# Patient Record
Sex: Male | Born: 1955
Health system: Southern US, Community
[De-identification: ages and names within clinical notes are randomized; demographics above are authoritative.]

## PROBLEM LIST (undated history)

## (undated) DIAGNOSIS — T4145XA Adverse effect of unspecified anesthetic, initial encounter: Secondary | ICD-10-CM

## (undated) DIAGNOSIS — I2510 Atherosclerotic heart disease of native coronary artery without angina pectoris: Secondary | ICD-10-CM

## (undated) DIAGNOSIS — E781 Pure hyperglyceridemia: Secondary | ICD-10-CM

## (undated) DIAGNOSIS — I519 Heart disease, unspecified: Secondary | ICD-10-CM

## (undated) DIAGNOSIS — E119 Type 2 diabetes mellitus without complications: Secondary | ICD-10-CM

## (undated) DIAGNOSIS — Z006 Encounter for examination for normal comparison and control in clinical research program: Secondary | ICD-10-CM

## (undated) DIAGNOSIS — J45909 Unspecified asthma, uncomplicated: Secondary | ICD-10-CM

## (undated) DIAGNOSIS — Z95820 Peripheral vascular angioplasty status with implants and grafts: Secondary | ICD-10-CM

## (undated) DIAGNOSIS — M67919 Unspecified disorder of synovium and tendon, unspecified shoulder: Secondary | ICD-10-CM

## (undated) DIAGNOSIS — T8859XA Other complications of anesthesia, initial encounter: Secondary | ICD-10-CM

## (undated) DIAGNOSIS — L4050 Arthropathic psoriasis, unspecified: Secondary | ICD-10-CM

## (undated) DIAGNOSIS — E538 Deficiency of other specified B group vitamins: Secondary | ICD-10-CM

## (undated) DIAGNOSIS — N529 Male erectile dysfunction, unspecified: Secondary | ICD-10-CM

## (undated) DIAGNOSIS — I10 Essential (primary) hypertension: Secondary | ICD-10-CM

## (undated) HISTORY — DX: Type 2 diabetes mellitus without complications: E11.9

## (undated) HISTORY — DX: Pure hyperglyceridemia: E78.1

## (undated) HISTORY — PX: OTHER SURGICAL HISTORY: SHX169

## (undated) HISTORY — PX: TOTAL KNEE ARTHROPLASTY: SHX125

## (undated) HISTORY — DX: Essential (primary) hypertension: I10

## (undated) HISTORY — DX: Unspecified asthma, uncomplicated: J45.909

## (undated) HISTORY — DX: Male erectile dysfunction, unspecified: N52.9

## (undated) HISTORY — DX: Unspecified disorder of synovium and tendon, unspecified shoulder: M67.919

## (undated) HISTORY — DX: Deficiency of other specified B group vitamins: E53.8

## (undated) HISTORY — DX: Arthropathic psoriasis, unspecified: L40.50

---
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
## 1978-11-24 HISTORY — PX: KNEE SURGERY: SHX244

## 1997-11-24 HISTORY — PX: APPENDECTOMY: SHX54

## 1998-04-18 ENCOUNTER — Other Ambulatory Visit: Admission: RE | Admit: 1998-04-18 | Discharge: 1998-04-18 | Payer: Self-pay | Admitting: Dermatology

## 1999-06-30 ENCOUNTER — Emergency Department (HOSPITAL_COMMUNITY): Admission: EM | Admit: 1999-06-30 | Discharge: 1999-06-30 | Payer: Self-pay | Admitting: Emergency Medicine

## 1999-06-30 ENCOUNTER — Encounter: Payer: Self-pay | Admitting: Emergency Medicine

## 2001-02-01 ENCOUNTER — Ambulatory Visit (HOSPITAL_COMMUNITY): Admission: RE | Admit: 2001-02-01 | Discharge: 2001-02-01 | Payer: Self-pay | Admitting: Gastroenterology

## 2004-05-13 ENCOUNTER — Ambulatory Visit (HOSPITAL_COMMUNITY): Admission: RE | Admit: 2004-05-13 | Discharge: 2004-05-13 | Payer: Self-pay | Admitting: Gastroenterology

## 2006-06-18 ENCOUNTER — Ambulatory Visit (HOSPITAL_COMMUNITY): Admission: RE | Admit: 2006-06-18 | Discharge: 2006-06-18 | Payer: Self-pay | Admitting: Internal Medicine

## 2016-03-10 DIAGNOSIS — E538 Deficiency of other specified B group vitamins: Secondary | ICD-10-CM | POA: Diagnosis not present

## 2016-04-04 DIAGNOSIS — J45909 Unspecified asthma, uncomplicated: Secondary | ICD-10-CM | POA: Diagnosis not present

## 2016-04-04 DIAGNOSIS — I10 Essential (primary) hypertension: Secondary | ICD-10-CM | POA: Diagnosis not present

## 2016-04-04 DIAGNOSIS — E1142 Type 2 diabetes mellitus with diabetic polyneuropathy: Secondary | ICD-10-CM | POA: Diagnosis not present

## 2016-04-04 DIAGNOSIS — G608 Other hereditary and idiopathic neuropathies: Secondary | ICD-10-CM | POA: Diagnosis not present

## 2016-04-10 DIAGNOSIS — D81818 Other biotin-dependent carboxylase deficiency: Secondary | ICD-10-CM | POA: Diagnosis not present

## 2016-05-08 DIAGNOSIS — H43813 Vitreous degeneration, bilateral: Secondary | ICD-10-CM | POA: Diagnosis not present

## 2016-05-08 DIAGNOSIS — E119 Type 2 diabetes mellitus without complications: Secondary | ICD-10-CM | POA: Diagnosis not present

## 2016-05-14 DIAGNOSIS — E538 Deficiency of other specified B group vitamins: Secondary | ICD-10-CM | POA: Diagnosis not present

## 2016-08-20 DIAGNOSIS — L4059 Other psoriatic arthropathy: Secondary | ICD-10-CM | POA: Diagnosis not present

## 2016-08-20 DIAGNOSIS — Z79899 Other long term (current) drug therapy: Secondary | ICD-10-CM | POA: Diagnosis not present

## 2016-08-20 DIAGNOSIS — L409 Psoriasis, unspecified: Secondary | ICD-10-CM | POA: Diagnosis not present

## 2016-08-20 DIAGNOSIS — M2550 Pain in unspecified joint: Secondary | ICD-10-CM | POA: Diagnosis not present

## 2016-08-22 DIAGNOSIS — Z23 Encounter for immunization: Secondary | ICD-10-CM | POA: Diagnosis not present

## 2016-08-26 DIAGNOSIS — I10 Essential (primary) hypertension: Secondary | ICD-10-CM | POA: Diagnosis not present

## 2016-08-26 DIAGNOSIS — Z0000 Encounter for general adult medical examination without abnormal findings: Secondary | ICD-10-CM | POA: Diagnosis not present

## 2016-08-26 DIAGNOSIS — G608 Other hereditary and idiopathic neuropathies: Secondary | ICD-10-CM | POA: Diagnosis not present

## 2016-08-26 DIAGNOSIS — E1142 Type 2 diabetes mellitus with diabetic polyneuropathy: Secondary | ICD-10-CM | POA: Diagnosis not present

## 2016-08-26 DIAGNOSIS — E782 Mixed hyperlipidemia: Secondary | ICD-10-CM | POA: Diagnosis not present

## 2016-08-26 DIAGNOSIS — E538 Deficiency of other specified B group vitamins: Secondary | ICD-10-CM | POA: Diagnosis not present

## 2016-09-03 DIAGNOSIS — M9903 Segmental and somatic dysfunction of lumbar region: Secondary | ICD-10-CM | POA: Diagnosis not present

## 2016-09-03 DIAGNOSIS — M9901 Segmental and somatic dysfunction of cervical region: Secondary | ICD-10-CM | POA: Diagnosis not present

## 2016-09-03 DIAGNOSIS — M9902 Segmental and somatic dysfunction of thoracic region: Secondary | ICD-10-CM | POA: Diagnosis not present

## 2016-09-03 DIAGNOSIS — M4812 Ankylosing hyperostosis [Forestier], cervical region: Secondary | ICD-10-CM | POA: Diagnosis not present

## 2016-09-24 DIAGNOSIS — R0789 Other chest pain: Secondary | ICD-10-CM | POA: Diagnosis not present

## 2016-09-28 NOTE — Progress Notes (Signed)
Cardiology Office Note  NEW PATIENT VISIT Date:  09/29/2016   ID:  Jermaine Jennings, DOB 1956/07/17, MRN [id number]  PCP:  Irven Shelling, MD  Cardiologist:  Dr. Angelena Form    Chief Complaint  Patient presents with  . Chest Pain    ongoing      History of Present Illness: Jermaine Jennings is a 60 y.o. male who presents for exertional chest pain.  He has a hx of DM-2, HTN, elevated triglycerides and FH of CAD.  Beginning about a year ago he began having chest pain with exertion. Not all the time.  Over the last month he has had increase of the tightness now occurring with any activity except simple walking.  No associated symptoms of nausea or diaphoresis.  He does have DOE with walking stairs fast and he does have hx of Asthma.  All his tightness goes away with rest.   Has not awakened from sleep.      Past Medical History:  Diagnosis Date  . Asthma   . Benign essential HTN   . DM (diabetes mellitus), type 2 ([place])   . Hypertriglyceridemia   . Psoriatic arthritis ([place])   . Rotator cuff disorder   . Vitamin B 12 deficiency     Past Surgical History:  Procedure Laterality Date  . APPENDECTOMY  1999  . colon polyps    . KNEE SURGERY Right 1975   ligament and spurs  . KNEE SURGERY Left 1980   ligaments and spurs  . TOTAL KNEE ARTHROPLASTY  1990's     Current Outpatient Prescriptions  Medication Sig Dispense Refill  . albuterol (PROVENTIL HFA;VENTOLIN HFA) 108 (90 Base) MCG/ACT inhaler Inhale 2 puffs into the lungs every 4 (four) hours as needed for wheezing or shortness of breath.    Marland Kitchen amitriptyline (ELAVIL) 50 MG tablet Take 100 mg by mouth daily.  0  . amLODipine (NORVASC) 5 MG tablet Take 5 mg by mouth daily.  1  . halobetasol (ULTRAVATE) 0.05 % cream Apply 1 application topically 2 (two) times daily as needed.    Marland Kitchen LANTUS SOLOSTAR 100 UNIT/ML Solostar Pen Inject 60 Units as directed daily.  1  . losartan (COZAAR) 50 MG tablet Take 50 mg by mouth daily.  1  .  LYRICA 150 MG capsule Take 150 mg by mouth 2 (two) times daily.  0  . metFORMIN (GLUCOPHAGE-XR) 500 MG 24 hr tablet Take 1,000 mg by mouth 2 (two) times daily.  1  . TRULICITY 1.5 0000000 SOPN Inject 0.5 mLs as directed once a week.  1   No current facility-administered medications for this visit.     Allergies:   Gabapentin; Indocin [indomethacin]; and Penicillins    Social History:  The patient  reports that he has never smoked. He has never used smokeless tobacco. He reports that he drinks alcohol. He reports that he does not use drugs.   Family History:  The patient's family history includes Arthritis in his maternal grandfather; Breast cancer in his mother; CAD in his mother; CVA in his father; Diabetes in his brother; Glaucoma in his mother; Healthy in his son and son; Heart disease in his mother; Hypertension in his brother, father, and mother; Neuropathy in his father; Other in his father.    ROS:  General:no colds or fevers, no weight changes Skin:no rashes or ulcers HEENT:no blurred vision, no congestion CV:see HPI PUL:see HPI GI:no diarrhea constipation or melena, no indigestion GU:no hematuria, no dysuria MS:+ Lt knee  pain, no claudication- though thighs tighten up with heavy exertion, resolves with rest. Neuro:no syncope, no lightheadedness Endo:+ diabetes on insulin usually controlled but HGBA1C was 8.3, no thyroid disease  Wt Readings from Last 3 Encounters:  09/29/16 251 lb (113.9 kg)     PHYSICAL EXAM: VS:  BP 120/78   Pulse 80   Ht 6\' 2"  (1.88 m)   Wt 251 lb (113.9 kg)   BMI 32.23 kg/m  , BMI Body mass index is 32.23 kg/m. General:Pleasant affect, NAD Skin:Warm and dry, brisk capillary refill HEENT:normocephalic, sclera clear, mucus membranes moist Neck:supple, no JVD, no bruits  Heart:S1S2 RRR without murmur, gallup, rub or click Lungs:clear without rales, rhonchi, or wheezes [id number], non tender, + BS, do not palpate liver spleen or masses Ext:no  lower ext edema, 2+ posterior tib pulses, 2+ radial pulses Neuro:alert and oriented X 3, MAE, follows commands, + facial symmetry    EKG:  EKG is ordered today. The ekg ordered today demonstrates SR normal EKG, only change from 09/24/16 is no longer with PVCs.    Recent Labs: No results found for requested labs within last 8760 hours.  Per [facility] records.    Lipid Panel No results found for: CHOL, TRIG, HDL, CHOLHDL, VLDL, LDLCALC, LDLDIRECT GLucose 126, Cr. 1.07, Na 139 K+ 4.1, AST 42, ALT 60 T chol 196, HDL 38, TG 440, NHDL 159    Other studies Reviewed: Additional studies/ records that were reviewed today include: previous OV notes and labs, 'EKG.   ASSESSMENT AND PLAN:  1.  Crescendo angina over last month with risk of DM-2, HTN, FH CAD and elevated TG.  Dr. Angelena Form has seen pt as well and we discussed treatment plan of cardiac cath, these symptoms sound anginal.  Plan for cath Friday this week with Dr. Angelena Form.  Pt has NTG sl if needed.    The patient understands that risks included but are not limited to stroke (1 in 1000), death (1 in 60), kidney failure [usually temporary] (1 in 500), bleeding (1 in 200), allergic reaction [possibly serious] (1 in 200).   2. DM-2 followed by Dr. Laurann Montana  3. HTN controlled with treatment.   4. Hypertriglyceridemia         Current medicines are reviewed with the patient today.  The patient Has no concerns regarding medicines.  The following changes have been made:  See above Labs/ tests ordered today include:see above  Disposition:   FU:  see above  Signed, Cecilie Kicks, NP  09/29/2016 10:13 AM    [place], [place], [place], [place] Phone: [phone]; Fax: [phone]  I have personally seen and examined this patient  I agree with the assessment and plan as outlined above.   Lauree Chandler 09/29/2016 1:42  PM

## 2016-09-29 ENCOUNTER — Encounter: Payer: Self-pay | Admitting: Cardiology

## 2016-09-29 ENCOUNTER — Ambulatory Visit (INDEPENDENT_AMBULATORY_CARE_PROVIDER_SITE_OTHER): Payer: BLUE CROSS/BLUE SHIELD | Admitting: Cardiology

## 2016-09-29 ENCOUNTER — Encounter (INDEPENDENT_AMBULATORY_CARE_PROVIDER_SITE_OTHER): Payer: Self-pay

## 2016-09-29 ENCOUNTER — Encounter: Payer: Self-pay | Admitting: *Deleted

## 2016-09-29 VITALS — BP 120/78 | HR 80 | Ht 74.0 in | Wt 251.0 lb

## 2016-09-29 DIAGNOSIS — N529 Male erectile dysfunction, unspecified: Secondary | ICD-10-CM | POA: Insufficient documentation

## 2016-09-29 DIAGNOSIS — J45909 Unspecified asthma, uncomplicated: Secondary | ICD-10-CM | POA: Insufficient documentation

## 2016-09-29 DIAGNOSIS — E1142 Type 2 diabetes mellitus with diabetic polyneuropathy: Secondary | ICD-10-CM | POA: Insufficient documentation

## 2016-09-29 DIAGNOSIS — I10 Essential (primary) hypertension: Secondary | ICD-10-CM | POA: Diagnosis not present

## 2016-09-29 DIAGNOSIS — E1122 Type 2 diabetes mellitus with diabetic chronic kidney disease: Secondary | ICD-10-CM

## 2016-09-29 DIAGNOSIS — R079 Chest pain, unspecified: Secondary | ICD-10-CM | POA: Diagnosis not present

## 2016-09-29 DIAGNOSIS — E669 Obesity, unspecified: Secondary | ICD-10-CM | POA: Insufficient documentation

## 2016-09-29 DIAGNOSIS — E538 Deficiency of other specified B group vitamins: Secondary | ICD-10-CM | POA: Diagnosis not present

## 2016-09-29 DIAGNOSIS — K200 Eosinophilic esophagitis: Secondary | ICD-10-CM | POA: Insufficient documentation

## 2016-09-29 DIAGNOSIS — Z01812 Encounter for preprocedural laboratory examination: Secondary | ICD-10-CM | POA: Diagnosis not present

## 2016-09-29 DIAGNOSIS — E781 Pure hyperglyceridemia: Secondary | ICD-10-CM | POA: Insufficient documentation

## 2016-09-29 DIAGNOSIS — D126 Benign neoplasm of colon, unspecified: Secondary | ICD-10-CM | POA: Insufficient documentation

## 2016-09-29 DIAGNOSIS — E782 Mixed hyperlipidemia: Secondary | ICD-10-CM | POA: Insufficient documentation

## 2016-09-29 DIAGNOSIS — L409 Psoriasis, unspecified: Secondary | ICD-10-CM | POA: Insufficient documentation

## 2016-09-29 DIAGNOSIS — L4050 Arthropathic psoriasis, unspecified: Secondary | ICD-10-CM | POA: Insufficient documentation

## 2016-09-29 DIAGNOSIS — G6289 Other specified polyneuropathies: Secondary | ICD-10-CM | POA: Insufficient documentation

## 2016-09-29 DIAGNOSIS — G608 Other hereditary and idiopathic neuropathies: Secondary | ICD-10-CM

## 2016-09-29 DIAGNOSIS — N181 Chronic kidney disease, stage 1: Secondary | ICD-10-CM

## 2016-09-29 LAB — BASIC METABOLIC PANEL
BUN: 12 mg/dL (ref 7–25)
CHLORIDE: 103 mmol/L (ref 98–110)
CO2: 24 mmol/L (ref 20–31)
CREATININE: 0.99 mg/dL (ref 0.70–1.25)
Calcium: 9.4 mg/dL (ref 8.6–10.3)
Glucose, Bld: 174 mg/dL — ABNORMAL HIGH (ref 65–99)
POTASSIUM: 4.4 mmol/L (ref 3.5–5.3)
SODIUM: 137 mmol/L (ref 135–146)

## 2016-09-29 LAB — CBC WITH DIFFERENTIAL/PLATELET
BASOS PCT: 1 %
Basophils Absolute: 94 cells/uL (ref 0–200)
EOS ABS: 376 {cells}/uL (ref 15–500)
Eosinophils Relative: 4 %
HCT: 40.1 % (ref 38.5–50.0)
Hemoglobin: 13.8 g/dL (ref 13.2–17.1)
LYMPHS ABS: 2068 {cells}/uL (ref 850–3900)
Lymphocytes Relative: 22 %
MCH: 28.9 pg (ref 27.0–33.0)
MCHC: 34.4 g/dL (ref 32.0–36.0)
MCV: 83.9 fL (ref 80.0–100.0)
MONO ABS: 658 {cells}/uL (ref 200–950)
MONOS PCT: 7 %
MPV: 9.2 fL (ref 7.5–12.5)
NEUTROS ABS: 6204 {cells}/uL (ref 1500–7800)
Neutrophils Relative %: 66 %
PLATELETS: 308 10*3/uL (ref 140–400)
RBC: 4.78 MIL/uL (ref 4.20–5.80)
RDW: 12.8 % (ref 11.0–15.0)
WBC: 9.4 10*3/uL (ref 3.8–10.8)

## 2016-09-29 NOTE — Patient Instructions (Addendum)
Medication Instructions:    Your physician recommends that you continue on your current medications as directed. Please refer to the Current Medication list given to you today.  --- If you need a refill on your cardiac medications before your next appointment, please call your pharmacy. ---  Labwork:  Pre procedure labs today: BMET & CBC w/ diff  Testing/Procedures: Your physician has requested that you have a cardiac catheterization. Cardiac catheterization is used to diagnose and/or treat various heart conditions. Doctors may recommend this procedure for a number of different reasons. The most common reason is to evaluate chest pain. Chest pain can be a symptom of coronary artery disease (CAD), and cardiac catheterization can show whether plaque is narrowing or blocking your heart's arteries. This procedure is also used to evaluate the valves, as well as measure the blood flow and oxygen levels in different parts of your heart. For further information please visit [email or web address]. Please follow instruction sheet, as given.  Follow-Up:  To be determined upon discharge from cardiac catheterization.  Thank you for choosing [facility]!!     Any Other Special Instructions Will Be Listed Below (If Applicable).  Coronary Angiogram A coronary angiogram, also called coronary angiography, is an X-ray procedure used to look at the arteries in the heart. In this procedure, a dye (contrast dye) is injected through a long, hollow tube (catheter). The catheter is about the size of a piece of cooked spaghetti and is inserted through your groin, wrist, or arm. The dye is injected into each artery, and X-rays are then taken to show if there is a blockage in the arteries of your heart. LET [facility] CARE PROVIDER KNOW ABOUT:  Any allergies you have, including allergies to shellfish or contrast dye.   All medicines you are taking, including vitamins, herbs, eye drops, creams, and  over-the-counter medicines.   Previous problems you or members of your family have had with the use of anesthetics.   Any blood disorders you have.   Previous surgeries you have had.  History of kidney problems or failure.   Other medical conditions you have. RISKS AND COMPLICATIONS  Generally, a coronary angiogram is a safe procedure. However, problems can occur and include:  Allergic reaction to the dye.  Bleeding from the access site or other locations.  Kidney injury, especially in people with impaired kidney function.  Stroke (rare).  Heart attack (rare). BEFORE THE PROCEDURE   Do not eat or drink anything after midnight the night before the procedure or as directed by your health care provider.   Ask your health care provider about changing or stopping your regular medicines. This is especially important if you are taking diabetes medicines or blood thinners. PROCEDURE  You may be given a medicine to help you relax (sedative) before the procedure. This medicine is given through an intravenous (IV) access tube that is inserted into one of your veins.   The area where the catheter will be inserted will be washed and shaved. This is usually done in the groin but may be done in the fold of your arm (near your elbow) or in the wrist.   A medicine will be given to numb the area where the catheter will be inserted (local anesthetic).   The health care provider will insert the catheter into an artery. The catheter will be guided by using a special type of X-ray (fluoroscopy) of the blood vessel being examined.   A special dye will then be injected into the  catheter, and X-rays will be taken. The dye will help to show where any narrowing or blockages are located in the heart arteries.  AFTER THE PROCEDURE   If the procedure is done through the leg, you will be kept in bed lying flat for several hours. You will be instructed to not bend or cross your legs.  The  insertion site will be checked frequently.   The pulse in your feet or wrist will be checked frequently.   Additional blood tests, X-rays, and an electrocardiogram may be done.    This information is not intended to replace advice given to you by your health care provider. Make sure you discuss any questions you have with your health care provider.   Document Released: 05/17/2003 Document Revised: 12/01/2014 Document Reviewed: 04/04/2013 Elsevier Interactive Patient Education [employer].

## 2016-10-03 ENCOUNTER — Encounter (HOSPITAL_COMMUNITY): Payer: Self-pay | Admitting: Cardiovascular Disease

## 2016-10-03 ENCOUNTER — Encounter (HOSPITAL_COMMUNITY)
Admission: RE | Disposition: A | Discharge status: Home / Self Care | Payer: Self-pay | Source: Ambulatory Visit | Attending: Cardiovascular Disease

## 2016-10-03 ENCOUNTER — Ambulatory Visit (HOSPITAL_COMMUNITY)
Admission: RE | Admit: 2016-10-03 | Discharge: 2016-10-04 | Disposition: A | Discharge status: Home / Self Care | Payer: BLUE CROSS/BLUE SHIELD | Source: Ambulatory Visit | Attending: Cardiovascular Disease | Admitting: Cardiovascular Disease

## 2016-10-03 DIAGNOSIS — I10 Essential (primary) hypertension: Secondary | ICD-10-CM | POA: Diagnosis not present

## 2016-10-03 DIAGNOSIS — I2582 Chronic total occlusion of coronary artery: Secondary | ICD-10-CM | POA: Insufficient documentation

## 2016-10-03 DIAGNOSIS — Z823 Family history of stroke: Secondary | ICD-10-CM | POA: Insufficient documentation

## 2016-10-03 DIAGNOSIS — E781 Pure hyperglyceridemia: Secondary | ICD-10-CM | POA: Diagnosis present

## 2016-10-03 DIAGNOSIS — I519 Heart disease, unspecified: Secondary | ICD-10-CM | POA: Diagnosis present

## 2016-10-03 DIAGNOSIS — I200 Unstable angina: Secondary | ICD-10-CM | POA: Diagnosis present

## 2016-10-03 DIAGNOSIS — J45909 Unspecified asthma, uncomplicated: Secondary | ICD-10-CM | POA: Diagnosis present

## 2016-10-03 DIAGNOSIS — I2510 Atherosclerotic heart disease of native coronary artery without angina pectoris: Secondary | ICD-10-CM | POA: Diagnosis present

## 2016-10-03 DIAGNOSIS — Z8249 Family history of ischemic heart disease and other diseases of the circulatory system: Secondary | ICD-10-CM | POA: Diagnosis not present

## 2016-10-03 DIAGNOSIS — Z006 Encounter for examination for normal comparison and control in clinical research program: Secondary | ICD-10-CM

## 2016-10-03 DIAGNOSIS — I25110 Atherosclerotic heart disease of native coronary artery with unstable angina pectoris: Secondary | ICD-10-CM | POA: Diagnosis not present

## 2016-10-03 DIAGNOSIS — Z794 Long term (current) use of insulin: Secondary | ICD-10-CM | POA: Insufficient documentation

## 2016-10-03 DIAGNOSIS — E119 Type 2 diabetes mellitus without complications: Secondary | ICD-10-CM | POA: Diagnosis not present

## 2016-10-03 DIAGNOSIS — Z880 Allergy status to penicillin: Secondary | ICD-10-CM | POA: Insufficient documentation

## 2016-10-03 DIAGNOSIS — Z955 Presence of coronary angioplasty implant and graft: Secondary | ICD-10-CM

## 2016-10-03 DIAGNOSIS — Z95820 Peripheral vascular angioplasty status with implants and grafts: Secondary | ICD-10-CM

## 2016-10-03 DIAGNOSIS — Z96659 Presence of unspecified artificial knee joint: Secondary | ICD-10-CM | POA: Insufficient documentation

## 2016-10-03 HISTORY — DX: Encounter for examination for normal comparison and control in clinical research program: Z00.6

## 2016-10-03 HISTORY — DX: Adverse effect of unspecified anesthetic, initial encounter: T41.45XA

## 2016-10-03 HISTORY — DX: Other complications of anesthesia, initial encounter: T88.59XA

## 2016-10-03 HISTORY — DX: Type 2 diabetes mellitus without complications: E11.9

## 2016-10-03 HISTORY — DX: Atherosclerotic heart disease of native coronary artery without angina pectoris: I25.10

## 2016-10-03 HISTORY — PX: CORONARY STENT PLACEMENT: SHX1402

## 2016-10-03 HISTORY — DX: Heart disease, unspecified: I51.9

## 2016-10-03 HISTORY — PX: CARDIAC CATHETERIZATION: SHX172

## 2016-10-03 HISTORY — DX: Peripheral vascular angioplasty status with implants and grafts: Z95.820

## 2016-10-03 LAB — GLUCOSE, CAPILLARY
GLUCOSE-CAPILLARY: 156 mg/dL — AB (ref 65–99)
GLUCOSE-CAPILLARY: 241 mg/dL — AB (ref 65–99)
GLUCOSE-CAPILLARY: 168 mg/dL — AB (ref 65–99)
Glucose-Capillary: 204 mg/dL — ABNORMAL HIGH (ref 65–99)

## 2016-10-03 LAB — POCT ACTIVATED CLOTTING TIME
ACTIVATED CLOTTING TIME: 731 s
Activated Clotting Time: 241 seconds
Activated Clotting Time: 0 seconds

## 2016-10-03 LAB — PROTIME-INR
INR: 0.99
PROTHROMBIN TIME: 13.1 s (ref 11.4–15.2)

## 2016-10-03 SURGERY — LEFT HEART CATH AND CORONARY ANGIOGRAPHY
Anesthesia: LOCAL

## 2016-10-03 MED ORDER — SODIUM CHLORIDE 0.9 % IV SOLN
INTRAVENOUS | Status: AC
  Administered 2016-10-03: 13:00:00 via INTRAVENOUS

## 2016-10-03 MED ORDER — IOPAMIDOL (ISOVUE-370) INJECTION 76%
INTRAVENOUS | Status: AC
  Filled 2016-10-03: qty 50

## 2016-10-03 MED ORDER — ASPIRIN 81 MG PO CHEW: 81.0000 mg | CHEWABLE_TABLET | ORAL | Status: DC

## 2016-10-03 MED ORDER — ALBUTEROL SULFATE HFA 108 (90 BASE) MCG/ACT IN AERS: 2.0000 | INHALATION_SPRAY | RESPIRATORY_TRACT | Status: DC | PRN

## 2016-10-03 MED ORDER — HEPARIN (PORCINE) IN NACL 2-0.9 UNIT/ML-% IJ SOLN
INTRAMUSCULAR | Status: AC
  Filled 2016-10-03: qty 1000

## 2016-10-03 MED ORDER — ACETAMINOPHEN 325 MG PO TABS: 650.0000 mg | ORAL_TABLET | ORAL | Status: DC | PRN

## 2016-10-03 MED ORDER — ALBUTEROL SULFATE (2.5 MG/3ML) 0.083% IN NEBU: 2.5000 mg | INHALATION_SOLUTION | RESPIRATORY_TRACT | Status: DC | PRN

## 2016-10-03 MED ORDER — VERAPAMIL HCL 2.5 MG/ML IV SOLN
INTRAVENOUS | Status: DC | PRN
  Administered 2016-10-03: 10 mL via INTRA_ARTERIAL

## 2016-10-03 MED ORDER — INSULIN GLARGINE 100 UNIT/ML SOLOSTAR PEN: 60.0000 [IU] | PEN_INJECTOR | Freq: Every day | SUBCUTANEOUS | Status: DC

## 2016-10-03 MED ORDER — SODIUM CHLORIDE 0.9 % WEIGHT BASED INFUSION: 1.0000 mL/kg/h | INTRAVENOUS | Status: DC

## 2016-10-03 MED ORDER — AMITRIPTYLINE HCL 25 MG PO TABS
100.0000 mg | ORAL_TABLET | Freq: Every day | ORAL | Status: DC
  Administered 2016-10-03: 21:00:00 100 mg via ORAL
  Filled 2016-10-03: qty 4

## 2016-10-03 MED ORDER — FENTANYL CITRATE (PF) 100 MCG/2ML IJ SOLN
INTRAMUSCULAR | Status: AC
  Filled 2016-10-03: qty 2

## 2016-10-03 MED ORDER — PREGABALIN 25 MG PO CAPS
150.0000 mg | ORAL_CAPSULE | Freq: Two times a day (BID) | ORAL | Status: DC
  Administered 2016-10-03 – 2016-10-04 (×3): 150 mg via ORAL
  Filled 2016-10-03 (×3): qty 1

## 2016-10-03 MED ORDER — TICAGRELOR 90 MG PO TABS
ORAL_TABLET | ORAL | Status: AC
  Filled 2016-10-03: qty 2

## 2016-10-03 MED ORDER — INSULIN GLARGINE 100 UNIT/ML ~~LOC~~ SOLN
60.0000 [IU] | Freq: Every day | SUBCUTANEOUS | Status: DC
  Administered 2016-10-03: 21:00:00 60 [IU] via SUBCUTANEOUS
  Filled 2016-10-03 (×2): qty 0.6

## 2016-10-03 MED ORDER — TICAGRELOR 90 MG PO TABS
90.0000 mg | ORAL_TABLET | Freq: Two times a day (BID) | ORAL | Status: DC
  Administered 2016-10-03 – 2016-10-04 (×2): 90 mg via ORAL
  Filled 2016-10-03 (×3): qty 1

## 2016-10-03 MED ORDER — MIDAZOLAM HCL 2 MG/2ML IJ SOLN
INTRAMUSCULAR | Status: DC | PRN
  Administered 2016-10-03: 1 mg via INTRAVENOUS
  Administered 2016-10-03: 2 mg via INTRAVENOUS

## 2016-10-03 MED ORDER — SODIUM CHLORIDE 0.9% FLUSH: 3.0000 mL | INTRAVENOUS | Status: DC | PRN

## 2016-10-03 MED ORDER — IOPAMIDOL (ISOVUE-370) INJECTION 76%
INTRAVENOUS | Status: AC
  Filled 2016-10-03: qty 100

## 2016-10-03 MED ORDER — TRAMADOL HCL 50 MG PO TABS: 50.0000 mg | ORAL_TABLET | Freq: Four times a day (QID) | ORAL | Status: DC | PRN

## 2016-10-03 MED ORDER — FENTANYL CITRATE (PF) 100 MCG/2ML IJ SOLN
INTRAMUSCULAR | Status: DC | PRN
  Administered 2016-10-03: 50 ug via INTRAVENOUS
  Administered 2016-10-03 (×3): 25 ug via INTRAVENOUS

## 2016-10-03 MED ORDER — INSULIN ASPART 100 UNIT/ML ~~LOC~~ SOLN
0.0000 [IU] | Freq: Three times a day (TID) | SUBCUTANEOUS | Status: DC
  Administered 2016-10-03: 13:00:00 2 [IU] via SUBCUTANEOUS
  Administered 2016-10-03: 3 [IU] via SUBCUTANEOUS
  Administered 2016-10-04: 1 [IU] via SUBCUTANEOUS

## 2016-10-03 MED ORDER — SODIUM CHLORIDE 0.9 % IV SOLN: 250.0000 mL | INTRAVENOUS | Status: DC | PRN

## 2016-10-03 MED ORDER — HEPARIN SODIUM (PORCINE) 1000 UNIT/ML IJ SOLN
INTRAMUSCULAR | Status: AC
  Filled 2016-10-03: qty 1

## 2016-10-03 MED ORDER — SODIUM CHLORIDE 0.9% FLUSH: 3.0000 mL | Freq: Two times a day (BID) | INTRAVENOUS | Status: DC

## 2016-10-03 MED ORDER — ANGIOPLASTY BOOK
Freq: Once | Status: AC
  Administered 2016-10-03: 19:00:00
  Filled 2016-10-03: qty 1

## 2016-10-03 MED ORDER — MIDAZOLAM HCL 2 MG/2ML IJ SOLN
INTRAMUSCULAR | Status: AC
  Filled 2016-10-03: qty 2

## 2016-10-03 MED ORDER — IOPAMIDOL (ISOVUE-370) INJECTION 76%
INTRAVENOUS | Status: DC | PRN
  Administered 2016-10-03: 240 mL via INTRA_ARTERIAL

## 2016-10-03 MED ORDER — TICAGRELOR 90 MG PO TABS
ORAL_TABLET | ORAL | Status: DC | PRN
  Administered 2016-10-03: 180 mg via ORAL

## 2016-10-03 MED ORDER — SODIUM CHLORIDE 0.9 % WEIGHT BASED INFUSION
3.0000 mL/kg/h | INTRAVENOUS | Status: DC
  Administered 2016-10-03: 3 mL/kg/h via INTRAVENOUS

## 2016-10-03 MED ORDER — HEPARIN SODIUM (PORCINE) 1000 UNIT/ML IJ SOLN
INTRAMUSCULAR | Status: DC | PRN
  Administered 2016-10-03: 4000 [IU] via INTRAVENOUS
  Administered 2016-10-03: 5000 [IU] via INTRAVENOUS
  Administered 2016-10-03: 6000 [IU] via INTRAVENOUS

## 2016-10-03 MED ORDER — LIDOCAINE HCL (PF) 1 % IJ SOLN
INTRAMUSCULAR | Status: AC
  Filled 2016-10-03: qty 30

## 2016-10-03 MED ORDER — LOSARTAN POTASSIUM 50 MG PO TABS
50.0000 mg | ORAL_TABLET | Freq: Every evening | ORAL | Status: DC
  Administered 2016-10-03: 18:00:00 50 mg via ORAL
  Filled 2016-10-03: qty 1

## 2016-10-03 MED ORDER — LIDOCAINE HCL (PF) 1 % IJ SOLN
INTRAMUSCULAR | Status: DC | PRN
  Administered 2016-10-03: 5 mL

## 2016-10-03 MED ORDER — HEPARIN (PORCINE) IN NACL 2-0.9 UNIT/ML-% IJ SOLN
INTRAMUSCULAR | Status: DC | PRN
  Administered 2016-10-03: 1000 mL

## 2016-10-03 MED ORDER — VERAPAMIL HCL 2.5 MG/ML IV SOLN
INTRAVENOUS | Status: AC
  Filled 2016-10-03: qty 2

## 2016-10-03 MED ORDER — ASPIRIN EC 81 MG PO TBEC
81.0000 mg | DELAYED_RELEASE_TABLET | Freq: Every day | ORAL | Status: DC
  Administered 2016-10-04: 09:00:00 81 mg via ORAL
  Filled 2016-10-03: qty 1

## 2016-10-03 MED ORDER — AMLODIPINE BESYLATE 5 MG PO TABS
5.0000 mg | ORAL_TABLET | Freq: Every evening | ORAL | Status: DC
  Administered 2016-10-03: 18:00:00 5 mg via ORAL
  Filled 2016-10-03: qty 1

## 2016-10-03 MED ORDER — ATORVASTATIN CALCIUM 80 MG PO TABS
80.0000 mg | ORAL_TABLET | Freq: Every day | ORAL | Status: DC
  Administered 2016-10-03: 18:00:00 80 mg via ORAL
  Filled 2016-10-03: qty 1

## 2016-10-03 MED ORDER — ONDANSETRON HCL 4 MG/2ML IJ SOLN: 4.0000 mg | Freq: Four times a day (QID) | INTRAMUSCULAR | Status: DC | PRN

## 2016-10-03 SURGICAL SUPPLY — 23 items
BALLN MOZEC 2.0X12 (BALLOONS) ×2
BALLN ~~LOC~~ MOZEC 2.75X18 (BALLOONS) ×2
BALLN ~~LOC~~ MOZEC 3.0X10 (BALLOONS) ×2
BALLOON MOZEC 2.0X12 (BALLOONS) IMPLANT
BALLOON ~~LOC~~ MOZEC 2.75X18 (BALLOONS) IMPLANT
BALLOON ~~LOC~~ MOZEC 3.0X10 (BALLOONS) IMPLANT
CATH 5FR JL3.5 JR4 ANG PIG MP (CATHETERS) ×1 IMPLANT
CATH INFINITI JR4 5F (CATHETERS) ×1 IMPLANT
CATH VISTA GUIDE 6FR XBLAD3.5 (CATHETERS) ×1 IMPLANT
DEVICE RAD COMP TR BAND LRG (VASCULAR PRODUCTS) ×1 IMPLANT
GLIDESHEATH SLEND SS 6F .021 (SHEATH) ×1 IMPLANT
GUIDE CATH RUNWAY 6FR AL 75 (CATHETERS) ×1 IMPLANT
GUIDEWIRE INQWIRE 1.5J.035X260 (WIRE) IMPLANT
INQWIRE 1.5J .035X260CM (WIRE) ×2
KIT ENCORE 26 ADVANTAGE (KITS) ×1 IMPLANT
KIT HEART LEFT (KITS) ×2 IMPLANT
PACK CARDIAC CATHETERIZATION (CUSTOM PROCEDURE TRAY) ×2 IMPLANT
STENT PROMUS PREM MR 2.5X28 (Permanent Stent) ×1 IMPLANT
STENT PROMUS PREM MR 2.75X16 (Permanent Stent) ×1 IMPLANT
SYR MEDRAD MARK V 150ML (SYRINGE) ×2 IMPLANT
TRANSDUCER W/STOPCOCK (MISCELLANEOUS) ×2 IMPLANT
TUBING CIL FLEX 10 FLL-RA (TUBING) ×2 IMPLANT
WIRE COUGAR XT STRL 190CM (WIRE) ×1 IMPLANT

## 2016-10-03 NOTE — H&P (View-Only) (Signed)
Cardiology Office Note  NEW PATIENT VISIT Date:  09/29/2016   ID:  Jermaine Jennings, DOB 14-Oct-1956, MRN [id number]  PCP:  Irven Shelling, MD  Cardiologist:  Dr. Angelena Form    Chief Complaint  Patient presents with  . Chest Pain    ongoing      History of Present Illness: Jermaine Jennings is a 60 y.o. male who presents for exertional chest pain.  He has a hx of DM-2, HTN, elevated triglycerides and FH of CAD.  Beginning about a year ago he began having chest pain with exertion. Not all the time.  Over the last month he has had increase of the tightness now occurring with any activity except simple walking.  No associated symptoms of nausea or diaphoresis.  He does have DOE with walking stairs fast and he does have hx of Asthma.  All his tightness goes away with rest.   Has not awakened from sleep.      Past Medical History:  Diagnosis Date  . Asthma   . Benign essential HTN   . DM (diabetes mellitus), type 2 ([place])   . Hypertriglyceridemia   . Psoriatic arthritis ([place])   . Rotator cuff disorder   . Vitamin B 12 deficiency     Past Surgical History:  Procedure Laterality Date  . APPENDECTOMY  1999  . colon polyps    . KNEE SURGERY Right 1975   ligament and spurs  . KNEE SURGERY Left 1980   ligaments and spurs  . TOTAL KNEE ARTHROPLASTY  1990's     Current Outpatient Prescriptions  Medication Sig Dispense Refill  . albuterol (PROVENTIL HFA;VENTOLIN HFA) 108 (90 Base) MCG/ACT inhaler Inhale 2 puffs into the lungs every 4 (four) hours as needed for wheezing or shortness of breath.    Marland Kitchen amitriptyline (ELAVIL) 50 MG tablet Take 100 mg by mouth daily.  0  . amLODipine (NORVASC) 5 MG tablet Take 5 mg by mouth daily.  1  . halobetasol (ULTRAVATE) 0.05 % cream Apply 1 application topically 2 (two) times daily as needed.    Marland Kitchen LANTUS SOLOSTAR 100 UNIT/ML Solostar Pen Inject 60 Units as directed daily.  1  . losartan (COZAAR) 50 MG tablet Take 50 mg by mouth daily.  1  .  LYRICA 150 MG capsule Take 150 mg by mouth 2 (two) times daily.  0  . metFORMIN (GLUCOPHAGE-XR) 500 MG 24 hr tablet Take 1,000 mg by mouth 2 (two) times daily.  1  . TRULICITY 1.5 0000000 SOPN Inject 0.5 mLs as directed once a week.  1   No current facility-administered medications for this visit.     Allergies:   Gabapentin; Indocin [indomethacin]; and Penicillins    Social History:  The patient  reports that he has never smoked. He has never used smokeless tobacco. He reports that he drinks alcohol. He reports that he does not use drugs.   Family History:  The patient's family history includes Arthritis in his maternal grandfather; Breast cancer in his mother; CAD in his mother; CVA in his father; Diabetes in his brother; Glaucoma in his mother; Healthy in his son and son; Heart disease in his mother; Hypertension in his brother, father, and mother; Neuropathy in his father; Other in his father.    ROS:  General:no colds or fevers, no weight changes Skin:no rashes or ulcers HEENT:no blurred vision, no congestion CV:see HPI PUL:see HPI GI:no diarrhea constipation or melena, no indigestion GU:no hematuria, no dysuria MS:+ Lt knee  pain, no claudication- though thighs tighten up with heavy exertion, resolves with rest. Neuro:no syncope, no lightheadedness Endo:+ diabetes on insulin usually controlled but HGBA1C was 8.3, no thyroid disease  Wt Readings from Last 3 Encounters:  09/29/16 251 lb (113.9 kg)     PHYSICAL EXAM: VS:  BP 120/78   Pulse 80   Ht 6\' 2"  (1.88 m)   Wt 251 lb (113.9 kg)   BMI 32.23 kg/m  , BMI Body mass index is 32.23 kg/m. General:Pleasant affect, NAD Skin:Warm and dry, brisk capillary refill HEENT:normocephalic, sclera clear, mucus membranes moist Neck:supple, no JVD, no bruits  Heart:S1S2 RRR without murmur, gallup, rub or click Lungs:clear without rales, rhonchi, or wheezes [id number], non tender, + BS, do not palpate liver spleen or masses Ext:no  lower ext edema, 2+ posterior tib pulses, 2+ radial pulses Neuro:alert and oriented X 3, MAE, follows commands, + facial symmetry    EKG:  EKG is ordered today. The ekg ordered today demonstrates SR normal EKG, only change from 09/24/16 is no longer with PVCs.    Recent Labs: No results found for requested labs within last 8760 hours.  Per [facility] records.    Lipid Panel No results found for: CHOL, TRIG, HDL, CHOLHDL, VLDL, LDLCALC, LDLDIRECT GLucose 126, Cr. 1.07, Na 139 K+ 4.1, AST 42, ALT 60 T chol 196, HDL 38, TG 440, NHDL 159    Other studies Reviewed: Additional studies/ records that were reviewed today include: previous OV notes and labs, 'EKG.   ASSESSMENT AND PLAN:  1.  Crescendo angina over last month with risk of DM-2, HTN, FH CAD and elevated TG.  Dr. Angelena Form has seen pt as well and we discussed treatment plan of cardiac cath, these symptoms sound anginal.  Plan for cath Friday this week with Dr. Angelena Form.  Pt has NTG sl if needed.    The patient understands that risks included but are not limited to stroke (1 in 1000), death (1 in 40), kidney failure [usually temporary] (1 in 500), bleeding (1 in 200), allergic reaction [possibly serious] (1 in 200).   2. DM-2 followed by Dr. Laurann Montana  3. HTN controlled with treatment.   4. Hypertriglyceridemia         Current medicines are reviewed with the patient today.  The patient Has no concerns regarding medicines.  The following changes have been made:  See above Labs/ tests ordered today include:see above  Disposition:   FU:  see above  Signed, Cecilie Kicks, NP  09/29/2016 10:13 AM    [place] [place], [employer], [place], [place] Phone: [phone]; Fax: [phone]  I have personally seen and examined this patient  I agree with the assessment and plan as outlined above.   Lauree Chandler 09/29/2016 1:42  PM

## 2016-10-03 NOTE — Research (Signed)
CADLAD Informed Consent   Subject Name: Jermaine Jennings  Subject met inclusion and exclusion criteria.  The informed consent form, study requirements and expectations were reviewed with the subject and questions and concerns were addressed prior to the signing of the consent form.  The subject verbalized understanding of the trail requirements.  The subject agreed to participate in the CADLAD trial and signed the informed consent.  The informed consent was obtained prior to performance of any protocol-specific procedures for the subject.  A copy of the signed informed consent was given to the subject and a copy was placed in the subject's medical record.  Mable Fill, Marissa Nestle 10/03/2016, 9:48am

## 2016-10-03 NOTE — Research (Signed)
TWIGHLIGHT Informed Consent   Subject Name: Jermaine Jennings  Subject met inclusion and exclusion criteria.  The informed consent form, study requirements and expectations were reviewed with the subject and questions and concerns were addressed prior to the signing of the consent form.  The subject verbalized understanding of the trail requirements.  The subject agreed to participate in the TWILIGHT trial and signed the informed consent.  The informed consent was obtained prior to performance of any protocol-specific procedures for the subject.  A copy of the signed informed consent was given to the subject and a copy was placed in the subject's medical record.  Mable Fill, Marissa Nestle 10/03/2016, 3:42pm

## 2016-10-03 NOTE — Interval H&P Note (Signed)
History and Physical Interval Note:  10/03/2016 10:58 AM  Everardo All  has presented today for surgery, with the diagnosis of unstable angina.  The various methods of treatment have been discussed with the patient and family. After consideration of risks, benefits and other options for treatment, the patient has consented to  Procedure(s): Left Heart Cath and Coronary Angiography (N/A) as a surgical intervention .  The patient's history has been reviewed, patient examined, no change in status, stable for surgery.  I have reviewed the patient's chart and labs.  Questions were answered to the patient's satisfaction.    Cath Lab Visit (complete for each Cath Lab visit)  Clinical Evaluation Leading to the Procedure:   ACS: No.  Non-ACS:    Anginal Classification: CCS III  Anti-ischemic medical therapy: Minimal Therapy (1 class of medications)  Non-Invasive Test Results: No non-invasive testing performed  Prior CABG: No previous CABG         Lauree Chandler

## 2016-10-03 NOTE — Care Management (Addendum)
1533 10-03-16 Jacqlyn Krauss, RN,BSN [phone] CM had Benefits check in process for patient for Bril[facility]. CM to provide pt with 30 day free card/ co pay card for Brilinta. Pt has been enrolled in the [facility] and will not need 30 day free card. No further needs at this time.   BRILINTA 90 MG BID   COVER- YES  CO-PAY- $ 60.00  TIER- 3 DRUG  PRIOR APPROVAL- NO  PHARMACY : [facility]

## 2016-10-03 NOTE — Progress Notes (Signed)
TR BAND REMOVAL  LOCATION:    right radial  DEFLATED PER PROTOCOL:    Yes.    TIME BAND OFF / DRESSING APPLIED:    1630   SITE UPON ARRIVAL:    Level 0  SITE AFTER BAND REMOVAL:    Level 0  CIRCULATION SENSATION AND MOVEMENT:    Within Normal Limits   Yes.    COMMENTS:    

## 2016-10-04 ENCOUNTER — Encounter (HOSPITAL_COMMUNITY): Payer: Self-pay | Admitting: Cardiology

## 2016-10-04 DIAGNOSIS — E119 Type 2 diabetes mellitus without complications: Secondary | ICD-10-CM

## 2016-10-04 DIAGNOSIS — I25110 Atherosclerotic heart disease of native coronary artery with unstable angina pectoris: Secondary | ICD-10-CM | POA: Diagnosis not present

## 2016-10-04 DIAGNOSIS — Z96659 Presence of unspecified artificial knee joint: Secondary | ICD-10-CM | POA: Diagnosis not present

## 2016-10-04 DIAGNOSIS — I200 Unstable angina: Secondary | ICD-10-CM | POA: Diagnosis not present

## 2016-10-04 DIAGNOSIS — I10 Essential (primary) hypertension: Secondary | ICD-10-CM | POA: Diagnosis not present

## 2016-10-04 DIAGNOSIS — Z006 Encounter for examination for normal comparison and control in clinical research program: Secondary | ICD-10-CM

## 2016-10-04 DIAGNOSIS — E781 Pure hyperglyceridemia: Secondary | ICD-10-CM | POA: Diagnosis not present

## 2016-10-04 DIAGNOSIS — I519 Heart disease, unspecified: Secondary | ICD-10-CM | POA: Diagnosis not present

## 2016-10-04 DIAGNOSIS — J45909 Unspecified asthma, uncomplicated: Secondary | ICD-10-CM | POA: Diagnosis not present

## 2016-10-04 DIAGNOSIS — Z959 Presence of cardiac and vascular implant and graft, unspecified: Secondary | ICD-10-CM

## 2016-10-04 DIAGNOSIS — I2582 Chronic total occlusion of coronary artery: Secondary | ICD-10-CM | POA: Diagnosis not present

## 2016-10-04 DIAGNOSIS — I2510 Atherosclerotic heart disease of native coronary artery without angina pectoris: Secondary | ICD-10-CM

## 2016-10-04 DIAGNOSIS — Z823 Family history of stroke: Secondary | ICD-10-CM | POA: Diagnosis not present

## 2016-10-04 DIAGNOSIS — Z95820 Peripheral vascular angioplasty status with implants and grafts: Secondary | ICD-10-CM

## 2016-10-04 DIAGNOSIS — Z8249 Family history of ischemic heart disease and other diseases of the circulatory system: Secondary | ICD-10-CM | POA: Diagnosis not present

## 2016-10-04 DIAGNOSIS — Z794 Long term (current) use of insulin: Secondary | ICD-10-CM | POA: Diagnosis not present

## 2016-10-04 DIAGNOSIS — Z880 Allergy status to penicillin: Secondary | ICD-10-CM | POA: Diagnosis not present

## 2016-10-04 HISTORY — DX: Heart disease, unspecified: I51.9

## 2016-10-04 HISTORY — DX: Atherosclerotic heart disease of native coronary artery without angina pectoris: I25.10

## 2016-10-04 HISTORY — DX: Peripheral vascular angioplasty status with implants and grafts: Z95.820

## 2016-10-04 HISTORY — DX: Type 2 diabetes mellitus without complications: E11.9

## 2016-10-04 HISTORY — DX: Encounter for examination for normal comparison and control in clinical research program: Z00.6

## 2016-10-04 LAB — BASIC METABOLIC PANEL
ANION GAP: 11 (ref 5–15)
BUN: 15 mg/dL (ref 6–20)
CALCIUM: 9.3 mg/dL (ref 8.9–10.3)
CO2: 23 mmol/L (ref 22–32)
Chloride: 106 mmol/L (ref 101–111)
Creatinine, Ser: 1.18 mg/dL (ref 0.61–1.24)
GLUCOSE: 135 mg/dL — AB (ref 65–99)
POTASSIUM: 3.9 mmol/L (ref 3.5–5.1)
Sodium: 140 mmol/L (ref 135–145)

## 2016-10-04 LAB — CBC
HEMATOCRIT: 38.9 % — AB (ref 39.0–52.0)
HEMOGLOBIN: 12.8 g/dL — AB (ref 13.0–17.0)
MCH: 28.3 pg (ref 26.0–34.0)
MCHC: 32.9 g/dL (ref 30.0–36.0)
MCV: 86.1 fL (ref 78.0–100.0)
Platelets: 270 10*3/uL (ref 150–400)
RBC: 4.52 MIL/uL (ref 4.22–5.81)
RDW: 12.7 % (ref 11.5–15.5)
WBC: 10.3 10*3/uL (ref 4.0–10.5)

## 2016-10-04 LAB — GLUCOSE, CAPILLARY: GLUCOSE-CAPILLARY: 144 mg/dL — AB (ref 65–99)

## 2016-10-04 MED ORDER — NITROGLYCERIN 0.4 MG SL SUBL
0.4000 mg | SUBLINGUAL_TABLET | SUBLINGUAL | 12 refills | Status: AC | PRN
Start: 2016-10-04 — End: ?

## 2016-10-04 MED ORDER — TICAGRELOR 90 MG PO TABS: 90.0000 mg | ORAL_TABLET | Freq: Two times a day (BID) | ORAL | Status: DC

## 2016-10-04 MED ORDER — CARVEDILOL 6.25 MG PO TABS: 6.2500 mg | ORAL_TABLET | Freq: Two times a day (BID) | ORAL | 6 refills | Status: DC

## 2016-10-04 MED ORDER — NITROGLYCERIN 0.4 MG SL SUBL: 0.4000 mg | SUBLINGUAL_TABLET | SUBLINGUAL | Status: DC | PRN

## 2016-10-04 MED ORDER — CARVEDILOL 3.125 MG PO TABS
6.2500 mg | ORAL_TABLET | Freq: Two times a day (BID) | ORAL | Status: DC
  Administered 2016-10-04: 6.25 mg via ORAL
  Filled 2016-10-04: qty 2

## 2016-10-04 MED ORDER — ACETAMINOPHEN 325 MG PO TABS: 650.0000 mg | ORAL_TABLET | ORAL | Status: AC | PRN

## 2016-10-04 MED ORDER — METFORMIN HCL ER 500 MG PO TB24: 1000.0000 mg | ORAL_TABLET | Freq: Two times a day (BID) | ORAL | 1 refills | Status: AC

## 2016-10-04 MED ORDER — POTASSIUM CHLORIDE 20 MEQ PO PACK: 20.0000 meq | PACK | Freq: Once | ORAL | Status: DC

## 2016-10-04 MED ORDER — ATORVASTATIN CALCIUM 80 MG PO TABS: 80.0000 mg | ORAL_TABLET | Freq: Every day | ORAL | 6 refills | Status: DC

## 2016-10-04 NOTE — Discharge Summary (Addendum)
Physician Discharge Summary       Patient ID: Jermaine Jennings MRN: [id number] DOB/AGE: 1956-05-24 60 y.o.  Admit date: 10/03/2016 Discharge date: 10/04/2016 Primary Cardiologist:Dr. Angelena Form   Discharge Diagnoses:  Principal Problem:   Unstable angina [facility]) Active Problems:   S/P angioplasty with stent, 10/03/16 DES to pRCA & DES to Diag   Essential hypertension   Pure hypertriglyceridemia   Uncomplicated asthma   CAD in native artery   Asymptomatic LV dysfunction EF by cath 45-50%    Diabetes type 2, controlled ([place])   Research study patient  TWILIGHT RESEARCH STUDY  Discharged Condition: good  Procedures: 10/03/16 cardiac cath with Dr. Angelena Form Procedures   Coronary Stent Intervention  Left Heart Cath and Coronary Angiography  Conclusion     There is mild left ventricular systolic dysfunction.  LV end diastolic pressure is normal.  The left ventricular ejection fraction is 45-50% by visual estimate.  There is no mitral valve regurgitation.  Ost 1st Mrg lesion, 100 %stenosed.  A STENT PROMUS PREM MR 2.5X28 drug eluting stent was successfully placed.  Ost RCA to Prox RCA lesion, 99 %stenosed.  Post intervention, there is a 0% residual stenosis.  A STENT PROMUS PREM MR 2.75X16 drug eluting stent was successfully placed.  1st Diag lesion, 99 %stenosed.  Post intervention, there is a 0% residual stenosis.   1. Unstable angina 2. Severe stenosis proximal moderate caliber co-dominant RCA now s/p successful PTCA/DES x 1 proximal RCA.  3. Severe stenosis proximal segment of large Diagonal branch now s/p successful PTCA/DES x 1 Diagonal branch.  4. Chronic occlusion large early obtuse marginal branch. This branch fills from left to left collaterals.  5. Moderate disease mid LAD 6. Mild LV systolic dysfunction  Recommendations: Will continue ASA and Brilinta (TWILIGHT study). Start a beta blocker and statin. Home tomorrow.      Hospital Course:  60  y.o. male who presents for exertional chest pain.  He has a hx of DM-2, HTN, elevated triglycerides and FH of CAD.  Beginning about a year ago he began having chest pain with exertion. Not all the time.  Over the last month he has had increase of the tightness now occurring with any activity except simple walking.  No associated symptoms of nausea or diaphoresis.  He does have DOE with walking stairs fast and he does have hx of Asthma.  All his tightness goes away with rest.   Has not awakened from sleep.  He was seen in the office and cardiac cath was planned with his risk factors.  Cath with + CAD  And stents to pRCA and Diag with DES, residual LAD disease. Also chronically occluded large OM that fills from Lt to Lt collaterals.  Also with reduced EF 45-50%.   Today no chest pain no SOB on Brilinta and ASA with Twilight study.  He is on high dose statin and ARB, and BB added.  Cardiac rehab to walk prior to discharge.  Check outpt echo. Glucose controlled. No metformin for 48 hours. Will do outpt echo -will order on follow up visit.  Will need follow up lipids in several weeks. Again will order on follow up.   EKG SR no changes.  He will follow up as outpt.  Dr. Meda Coffee has seen and found stable for discharge.    Consults: None  Significant Diagnostic Studies:  BMP Latest Ref Rng & Units 10/04/2016 09/29/2016  Glucose 65 - 99 mg/dL 135(H) 174(H)  BUN 6 - 20 mg/dL  15 12  Creatinine 0.61 - 1.24 mg/dL 1.18 0.99  Sodium 135 - 145 mmol/L 140 137  Potassium 3.5 - 5.1 mmol/L 3.9 4.4  Chloride 101 - 111 mmol/L 106 103  CO2 22 - 32 mmol/L 23 24  Calcium 8.9 - 10.3 mg/dL 9.3 9.4   CBC Latest Ref Rng & Units 10/04/2016 09/29/2016  WBC 4.0 - 10.5 K/uL 10.3 9.4  Hemoglobin 13.0 - 17.0 g/dL 12.8(L) 13.8  Hematocrit 39.0 - 52.0 % 38.9(L) 40.1  Platelets 150 - 400 K/uL 270 308    . amitriptyline  100 mg Oral QHS  . amLODipine  5 mg Oral QPM  . aspirin EC  81 mg Oral Daily  . atorvastatin  80 mg Oral q1800   . carvedilol  6.25 mg Oral BID WC  . insulin aspart  0-9 Units Subcutaneous TID WC  . insulin glargine  60 Units Subcutaneous QHS  . losartan  50 mg Oral QPM  . pregabalin  150 mg Oral BID  . sodium chloride flush  3 mL Intravenous Q12H  . ticagrelor  90 mg Oral BID   Discharge Exam: Blood pressure (!) 145/76, pulse 83, temperature 97.7 F (36.5 C), temperature source Oral, resp. rate 18, height 6\' 2"  (1.88 m), weight 251 lb 5.2 oz (114 kg), SpO2 96 %.  General:Pleasant affect, NAD Skin:Warm and dry, brisk capillary refill HEENT:normocephalic, sclera clear, mucus membranes moist Neck:supple, no JVD, no bruits  Heart:S1S2 RRR without murmur, gallup, rub or click Lungs:clear without rales, rhonchi, or wheezes [id number], non tender, + BS, do not palpate liver spleen or masses Ext:no lower ext edema, 2+ pedal pulses, 2+ radial pulses cath site stable.  Neuro:alert and oriented, MAE, follows commands, + facial symmetry   Disposition:  Home  Follow-up Information    Lauree Chandler, MD Follow up.   Specialty:  Cardiology Why:  the office will call with date and time.   Contact information: [address] [place] [address] [phone]          Discharge Instructions: Take 1 NTG, under your tongue, while sitting.  If no relief of pain may repeat NTG, one tab every 5 minutes up to 3 tablets total over 15 minutes.  If no relief CALL 911.  If you have dizziness/lightheadness  while taking NTG, stop taking and call 911.        Call [facility] at [phone] if any bleeding, swelling or drainage at cath site.  May shower, no tub baths for 48 hours for groin sticks. No lifting over 5 pounds for 3 days.  No Driving for 3 days.  Heart Healthy Diabetic Diet  Do Not stop Brilinta or Asprin, these keep your 2 stents open. Stopping could cause a heart attack.   We recommend cardiac rehab.   Do not take metformin until Monday morning it can  interact with the cath dye.  We added coreg to your meds to help your heart.   Signed: Cecilie Kicks Nurse Practitioner-Certified [facility]: [facility] 10/04/2016, 8:33 AM  Time spent on discharge > 35 minutes.     The patient was seen, examined and discussed with Cecilie Kicks, NP and I agree with the above.    60 y.o. male who presented with unstable angina, hx of DM-2, HTN, HLP and FH of CAD. Underwent a LHC yesterday with findings of  LVEF 45-50%, 100% occluded OM1, chronic, with collaterals. 99% ostial RCA -->PCI/DES, D1 99% --> PCI/DES.  Asymptomatic today. In Twilight study, ASA, Brilinta, atorvastatin 80 mg po daily, losartan, add carvedilol 6.25 mg po BID.  Follow up within 2 weeks, arrange for cardiac rehab. Repeat echocardiogram in 6 weeks.  Ena Dawley, MD 10/04/2016

## 2016-10-04 NOTE — Progress Notes (Signed)
CARDIAC REHAB PHASE I   PRE:  Rate/Rhythm: 96 SR  BP:  Supine:   Sitting: 130/70  Standing:    SaO2:   MODE:  Ambulation: 1000 ft   POST:  Rate/Rhythm: 115 ST  BP:  Supine:   Sitting: 134/71   Standing:    SaO2:  0820-0920 Pt tolerated ambulation well without c/o of cp or SOB. Hr before walk 96 SR and after 115 ST. Pt states that  he has been feeling his HR being elevated anytime that he exerts himself. Reported HR to RN and PA. Completed discharge education with pt and wife. They voice understanding. Referral sent to [place]. CRP. Pt has had very little diabetic education, I encouraged him to attend diabetic education classes and to discuss with Dr. Laurann Montana.  Rodney Langton RN 10/04/2016 9:20 AM

## 2016-10-04 NOTE — Discharge Instructions (Signed)
Take 1 NTG, under your tongue, while sitting.  If no relief of pain may repeat NTG, one tab every 5 minutes up to 3 tablets total over 15 minutes.  If no relief CALL 911.  If you have dizziness/lightheadness  while taking NTG, stop taking and call 911.        Call [facility] at [phone] if any bleeding, swelling or drainage at cath site.  May shower, no tub baths for 48 hours for groin sticks. No lifting over 5 pounds for 3 days.  No Driving for 3 days.  Heart Healthy Diabetic Diet  Do Not stop Brilinta or Asprin,(Twilight study)  these keep your 2 stents open. Stopping could cause a heart attack.   We recommend cardiac rehab.   We added coreg to your meds to help your heart.   Do not take metformin until Monday morning it can interact with the cath dye.

## 2016-10-07 ENCOUNTER — Other Ambulatory Visit: Payer: Self-pay | Admitting: *Deleted

## 2016-10-07 MED ORDER — AMBULATORY NON FORMULARY MEDICATION: 90.0000 mg | Freq: Two times a day (BID) | Status: DC

## 2016-10-07 MED ORDER — AMBULATORY NON FORMULARY MEDICATION: 81.0000 mg | Freq: Every day | Status: DC

## 2016-10-20 ENCOUNTER — Ambulatory Visit (INDEPENDENT_AMBULATORY_CARE_PROVIDER_SITE_OTHER): Payer: BLUE CROSS/BLUE SHIELD | Admitting: Physician Assistant

## 2016-10-20 ENCOUNTER — Encounter: Payer: Self-pay | Admitting: Physician Assistant

## 2016-10-20 VITALS — BP 140/76 | HR 79 | Ht 74.0 in | Wt 252.8 lb

## 2016-10-20 DIAGNOSIS — I2510 Atherosclerotic heart disease of native coronary artery without angina pectoris: Secondary | ICD-10-CM | POA: Diagnosis not present

## 2016-10-20 DIAGNOSIS — I10 Essential (primary) hypertension: Secondary | ICD-10-CM | POA: Diagnosis not present

## 2016-10-20 DIAGNOSIS — I519 Heart disease, unspecified: Secondary | ICD-10-CM

## 2016-10-20 MED ORDER — CARVEDILOL 12.5 MG PO TABS: 12.5000 mg | ORAL_TABLET | Freq: Two times a day (BID) | ORAL | 3 refills | Status: DC

## 2016-10-20 NOTE — Progress Notes (Signed)
Cardiology Office Note    Date:  10/20/2016   ID:  Jermaine Jennings, DOB Jan 05, 1956, MRN [id number]  PCP:  Irven Shelling, MD  Cardiologist: Dr. Angelena Form  Chief Complaint  Patient presents with  . Follow-up    History of Present Illness:  Jermaine Jennings is a 60 y.o. male history of diabetes mellitus type 2, hypertension, elevated triglycerides and family history of CAD who presented to the hospital with worsening dyspnea on exertion and chest tightness. He underwent cardiac catheterization 10/03/16 and was treated with a DES to the proximal RCA for severe stenosis, DES to the diagonal branch, chronic occlusion of a large obtuse marginal that fills from left to left collaterals, moderate disease in the mid LAD and mild LV systolic dysfunction EF [id number]. He was enrolled in the twilight study on aspirin and Brilinta.  Patient comes in today for follow-up. 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 He says he thinks he had a little tightness when walking uphill that eased instantly. He says he is just more sensitive right now and is not sure he really had it. He has chronic dyspnea on exertion from his asthma. He has had no further symptoms with working or walking. He doesn't have time to do cardiac rehabilitation.    Past Medical History:  Diagnosis Date  . Asthma   . Asymptomatic LV dysfunction 10/04/2016  . Benign essential HTN   . CAD in native artery 10/04/2016  . Complication of anesthesia    " DIFFICULTY BREATHING ONE TIME "  . Coronary artery disease   . Diabetes type 2, controlled ([place]) 10/04/2016  . DM (diabetes mellitus), type 2 ([place])   . Erectile dysfunction   . Hypertriglyceridemia   . Psoriatic arthritis ([place])   . Research study patient 10/04/2016  . Rotator cuff disorder   . S/P angioplasty with stent, 10/03/16 DES to [place] to Diag 10/04/2016  . Vitamin B 12 deficiency     Past Surgical History:  Procedure Laterality Date  . APPENDECTOMY   1999  . CARDIAC CATHETERIZATION N/A 10/03/2016   Procedure: Left Heart Cath and Coronary Angiography;  Surgeon: Burnell Blanks, MD;  Location: [place] [facility];  Service: Cardiovascular;  Laterality: N/A;  . CARDIAC CATHETERIZATION N/A 10/03/2016   Procedure: Coronary Stent Intervention;  Surgeon: Burnell Blanks, MD;  Location: [place] [facility];  Service: Cardiovascular;  Laterality: N/A;  . colon polyps    . CORONARY STENT PLACEMENT  10/03/2016   Severe stenosis proximal moderate caliber co-dominant RCA now s/p successful PTCA/DES x 1 proximal RCA.   Marland Kitchen KNEE SURGERY Right 1975   ligament and spurs  . KNEE SURGERY Left 1980   ligaments and spurs  . TOTAL KNEE ARTHROPLASTY  1990's    Current Medications: Outpatient Medications Prior to Visit  Medication Sig Dispense Refill  . acetaminophen (TYLENOL) 325 MG tablet Take 2 tablets (650 mg total) by mouth every 4 (four) hours as needed for headache or mild pain.    Marland Kitchen albuterol (PROVENTIL HFA;VENTOLIN HFA) 108 (90 Base) MCG/ACT inhaler Inhale 2 puffs into the lungs every 4 (four) hours as needed for wheezing or shortness of breath.    . AMBULATORY NON FORMULARY MEDICATION Take 90 mg by mouth 2 (two) times daily. Medication Name: BRILINTA 90 mg BID (TWILIGHT Research Study PROVIDED)    . AMBULATORY NON FORMULARY MEDICATION Take 81 mg by mouth daily. Medication Name: Aspirin 81 mg Daily (TWILIGHT Research  Study Provided)    . amitriptyline (ELAVIL) 100 MG tablet Take 100 mg by mouth at bedtime.    Marland Kitchen amLODipine (NORVASC) 5 MG tablet Take 5 mg by mouth every evening.   1  . atorvastatin (LIPITOR) 80 MG tablet Take 1 tablet (80 mg total) by mouth daily at 6 PM. 30 tablet 6  . halobetasol (ULTRAVATE) 0.05 % cream Apply 1 application topically 2 (two) times daily as needed (for psoriasis).     Marland Kitchen LANTUS SOLOSTAR 100 UNIT/ML Solostar Pen Inject 60 Units as directed at bedtime.   1  . losartan (COZAAR) 50 MG tablet Take 50 mg by mouth  every evening.   1  . LYRICA 150 MG capsule Take 150 mg by mouth 2 (two) times daily.  0  . metFORMIN (GLUCOPHAGE-XR) 500 MG 24 hr tablet Take 2 tablets (1,000 mg total) by mouth 2 (two) times daily.  1  . nitroGLYCERIN (NITROSTAT) 0.4 MG SL tablet Place 1 tablet (0.4 mg total) under the tongue every 5 (five) minutes as needed for chest pain. 25 tablet 12  . traMADol (ULTRAM) 50 MG tablet Take 50 mg by mouth every 6 (six) hours as needed (for pain.).    Marland Kitchen TRULICITY 1.5 0000000 SOPN Inject 1.5 mg as directed every Sunday.   1  . carvedilol (COREG) 6.25 MG tablet Take 1 tablet (6.25 mg total) by mouth 2 (two) times daily with a meal. 60 tablet 6   No facility-administered medications prior to visit.      Allergies:   Gabapentin; Indocin [indomethacin]; and Penicillins   Social History   Social History  . Marital status: Married    Spouse name: N/A  . Number of children: N/A  . Years of education: N/A   Social History Main Topics  . Smoking status: Never Smoker  . Smokeless tobacco: Never Used  . Alcohol use Yes     Comment: rarely  . Drug use: No  . Sexual activity: Not Asked   Other Topics Concern  . None   Social History Narrative  . None     Family History:  The patient's family history includes Arthritis in his maternal grandfather; Breast cancer in his mother; CAD in his mother; CVA in his father; Diabetes in his brother; Glaucoma in his mother; Healthy in his son and son; Heart disease in his mother; Hypertension in his brother, father, and mother; Neuropathy in his father; Other in his father.   ROS:   Please see the history of present illness.    Review of Systems  Constitution: Negative.  HENT: Negative.   Cardiovascular: Negative.   Respiratory: Negative.   Endocrine: Negative.   Hematologic/Lymphatic: Negative.   Musculoskeletal: Negative.   Gastrointestinal: Negative.   Genitourinary: Negative.   Neurological: Negative.    All other systems reviewed and  are negative.   PHYSICAL EXAM:   VS:  BP 140/76   Pulse 79   Ht 6\' 2"  (1.88 m)   Wt 252 lb 12.8 oz (114.7 kg)   BMI 32.46 kg/m   Physical Exam  GEN: Well nourished, well developed, in no acute distress  Neck: no JVD, carotid bruits, or masses Cardiac:RRR; no murmurs, rubs, or gallops  Respiratory:  clear to auscultation bilaterally, normal work of breathing GI: soft, nontender, nondistended, + BS Ext: Right arm at cath site without hematoma or hemorrhage, good radial brachial pulses, lower extremities without cyanosis, clubbing, or edema, Good distal pulses bilaterally MS: no deformity or atrophy  Skin:  warm and dry, no rash Psych: euthymic mood, full affect  Wt Readings from Last 3 Encounters:  10/20/16 252 lb 12.8 oz (114.7 kg)  10/04/16 251 lb 5.2 oz (114 kg)  09/29/16 251 lb (113.9 kg)      Studies/Labs Reviewed:   EKG:  EKG is ordered today.  The ekg ordered today demonstrates normal sinus rhythm, normal EKG  Recent Labs: 10/04/2016: BUN 15; Creatinine, Ser 1.18; Hemoglobin 12.8; Platelets 270; Potassium 3.9; Sodium 140   Lipid Panel No results found for: CHOL, TRIG, HDL, CHOLHDL, VLDL, LDLCALC, LDLDIRECT  Additional studies/ records that were reviewed today include:   Procedures: 10/03/16 cardiac cath with Dr. Angelena Form Procedures    Coronary Stent Intervention  Left Heart Cath and Coronary Angiography  Conclusion       There is mild left ventricular systolic dysfunction.  LV end diastolic pressure is normal.  The left ventricular ejection fraction is 45-50% by visual estimate.  There is no mitral valve regurgitation.  Ost 1st Mrg lesion, 100 %stenosed.  A STENT PROMUS PREM MR 2.5X28 drug eluting stent was successfully placed.  Ost RCA to Prox RCA lesion, 99 %stenosed.  Post intervention, there is a 0% residual stenosis.  A STENT PROMUS PREM MR 2.75X16 drug eluting stent was successfully placed.  1st Diag lesion, 99 %stenosed.  Post  intervention, there is a 0% residual stenosis.   1. Unstable angina 2. Severe stenosis proximal moderate caliber co-dominant RCA now s/p successful PTCA/DES x 1 proximal RCA.  3. Severe stenosis proximal segment of large Diagonal branch now s/p successful PTCA/DES x 1 Diagonal branch.  4. Chronic occlusion large early obtuse marginal branch. This branch fills from left to left collaterals.  5. Moderate disease mid LAD 6. Mild LV systolic dysfunction      ASSESSMENT:    1. CAD in native artery   2. Essential hypertension   3. Asymptomatic LV dysfunction EF by cath 45-50%       PLAN:  In order of problems listed above:  CAD status post recent DES to the RCA and diagonal with chronic occlusion of an obtuse marginal, moderate disease in the mid LAD and mild LV dysfunction. Patient thinks he may have had one episode of chest tightness but believes he is more sensitive in looking for problem at this point. Will increase carvedilol to 12.5 mg twice a day. He is advised to call us if he has any further symptoms of chest tightness. He also has asthma which may be playing a role. Follow-up with Dr.McAlhany in 2 months.  Essential hypertension blood pressure is up a little today. Increase carvedilol  LV dysfunction EF 45-50% at cardiac cath. Reevaluate with 2-D echo in 2 months. No evidence of heart failure and exam.     Medication Adjustments/Labs and Tests Ordered: Current medicines are reviewed at length with the patient today.  Concerns regarding medicines are outlined above.  Medication changes, Labs and Tests ordered today are listed in the Patient Instructions below. Patient Instructions  Medication Instructions:  Your physician has recommended you make the following change in your medication:  1-Increase Carvedilol 12.5 mg by mouth twice daily.  Labwork: NONE  Testing/Procedures: Your physician has requested that you have an echocardiogram in 2 months on same days as office  visit. Echocardiography is a painless test that uses sound waves to create images of your heart. It provides your doctor with information about the size and shape of your heart and how well your heart's chambers and  valves are working. This procedure takes approximately one hour. There are no restrictions for this procedure.  Follow-Up: Your physician wants you to follow-up in: 2 months with Dr. Angelena Form with echo the same day.   If you need a refill on your cardiac medications before your next appointment, please call your pharmacy.       Sumner Boast, PA-C  10/20/2016 10:29 AM    [facility] [address], [place], [place]  [address] Phone: [phone]; Fax: [phone]

## 2016-10-20 NOTE — Patient Instructions (Signed)
Medication Instructions:  Your physician has recommended you make the following change in your medication:  1-Increase Carvedilol 12.5 mg by mouth twice daily.  Labwork: NONE  Testing/Procedures: Your physician has requested that you have an echocardiogram in 2 months on same days as office visit. Echocardiography is a painless test that uses sound waves to create images of your heart. It provides your doctor with information about the size and shape of your heart and how well your heart's chambers and valves are working. This procedure takes approximately one hour. There are no restrictions for this procedure.  Follow-Up: Your physician wants you to follow-up in: 2 months with Dr. Angelena Form with echo the same day.   If you need a refill on your cardiac medications before your next appointment, please call your pharmacy.

## 2016-10-30 ENCOUNTER — Telehealth: Payer: Self-pay | Admitting: *Deleted

## 2016-10-30 DIAGNOSIS — E538 Deficiency of other specified B group vitamins: Secondary | ICD-10-CM | POA: Diagnosis not present

## 2016-10-30 NOTE — Telephone Encounter (Signed)
Left message for patient to call Research office for TWILIGHT Research study month 1 telephone follow up visit.

## 2016-11-04 ENCOUNTER — Encounter: Payer: Self-pay | Admitting: *Deleted

## 2016-11-04 DIAGNOSIS — Z006 Encounter for examination for normal comparison and control in clinical research program: Secondary | ICD-10-CM

## 2016-11-04 NOTE — Progress Notes (Signed)
TWILIGHT Research study month 1 telephone visit completed. Patient denies any bleed events or other adverse events. States he has been compliant with study provided medication. Next research required visit is due no later than 01/14/17. Questions encouraged and answered.

## 2016-12-01 DIAGNOSIS — E538 Deficiency of other specified B group vitamins: Secondary | ICD-10-CM | POA: Diagnosis not present

## 2016-12-10 ENCOUNTER — Other Ambulatory Visit (HOSPITAL_COMMUNITY): Payer: BLUE CROSS/BLUE SHIELD

## 2016-12-10 ENCOUNTER — Telehealth (HOSPITAL_COMMUNITY): Payer: Self-pay | Admitting: Radiology

## 2016-12-10 NOTE — Telephone Encounter (Signed)
left message about office closing due to imclement weather. no answer at mobile number

## 2016-12-12 DIAGNOSIS — E1142 Type 2 diabetes mellitus with diabetic polyneuropathy: Secondary | ICD-10-CM | POA: Diagnosis not present

## 2016-12-12 DIAGNOSIS — G608 Other hereditary and idiopathic neuropathies: Secondary | ICD-10-CM | POA: Diagnosis not present

## 2016-12-12 DIAGNOSIS — I10 Essential (primary) hypertension: Secondary | ICD-10-CM | POA: Diagnosis not present

## 2016-12-12 DIAGNOSIS — E1165 Type 2 diabetes mellitus with hyperglycemia: Secondary | ICD-10-CM | POA: Diagnosis not present

## 2016-12-12 DIAGNOSIS — I2510 Atherosclerotic heart disease of native coronary artery without angina pectoris: Secondary | ICD-10-CM | POA: Diagnosis not present

## 2016-12-26 ENCOUNTER — Other Ambulatory Visit: Payer: Self-pay

## 2016-12-26 ENCOUNTER — Ambulatory Visit (HOSPITAL_COMMUNITY): Payer: BLUE CROSS/BLUE SHIELD | Attending: Cardiovascular Disease

## 2016-12-26 DIAGNOSIS — I519 Heart disease, unspecified: Secondary | ICD-10-CM | POA: Diagnosis not present

## 2016-12-26 DIAGNOSIS — I2510 Atherosclerotic heart disease of native coronary artery without angina pectoris: Secondary | ICD-10-CM

## 2016-12-26 DIAGNOSIS — I10 Essential (primary) hypertension: Secondary | ICD-10-CM | POA: Insufficient documentation

## 2016-12-26 DIAGNOSIS — I071 Rheumatic tricuspid insufficiency: Secondary | ICD-10-CM | POA: Diagnosis not present

## 2016-12-26 DIAGNOSIS — E119 Type 2 diabetes mellitus without complications: Secondary | ICD-10-CM | POA: Insufficient documentation

## 2016-12-26 DIAGNOSIS — J45909 Unspecified asthma, uncomplicated: Secondary | ICD-10-CM | POA: Insufficient documentation

## 2016-12-29 ENCOUNTER — Telehealth (HOSPITAL_COMMUNITY): Payer: Self-pay | Admitting: Internal Medicine

## 2016-12-29 ENCOUNTER — Telehealth (HOSPITAL_COMMUNITY): Payer: Self-pay | Admitting: *Deleted

## 2016-12-29 NOTE — Telephone Encounter (Signed)
Returned call to pt from message left earlier today.  Message left on work and mobile phone.  Asked pt to please contact for sign up.  Contact information provided. Cherre Huger, BSN

## 2016-12-29 NOTE — Telephone Encounter (Signed)
S/w [place] with [employer], verifying coverage, Deductible $4000.00 which has not been met, Out of [place]ocket, $7150.00 which $164.80 has been met, with 40% Co-Insurance Reference # [id number]  .... Cherylann Banas

## 2016-12-31 ENCOUNTER — Telehealth: Payer: Self-pay | Admitting: Cardiovascular Disease

## 2016-12-31 NOTE — Telephone Encounter (Signed)
I spoke with pt and reviewed echo results with him.  

## 2016-12-31 NOTE — Telephone Encounter (Signed)
Jermaine Jennings is returning a call about his Echo results .  Thanks

## 2017-01-06 ENCOUNTER — Encounter: Payer: Self-pay | Admitting: *Deleted

## 2017-01-06 ENCOUNTER — Other Ambulatory Visit: Payer: Self-pay | Admitting: *Deleted

## 2017-01-06 DIAGNOSIS — Z006 Encounter for examination for normal comparison and control in clinical research program: Secondary | ICD-10-CM

## 2017-01-06 MED ORDER — AMBULATORY NON FORMULARY MEDICATION: 81.0000 mg | Freq: Every day | Status: DC

## 2017-01-06 NOTE — Progress Notes (Signed)
TWILIGHT Research study month 3 randomization visit completed. Patient denies any bleeding events or other adverse events. He also states he has been compliant with research provided medication. Today he has been randomized to ASA 81 mg daily or PL[place]BO as part of the TWILIGHT protocol. The following pill bottle numbers were dispensed: [id number]. Next research visit is due no later than 06/SEP/2018. Questions encouraged and answered.

## 2017-01-08 ENCOUNTER — Encounter: Payer: Self-pay | Admitting: Cardiovascular Disease

## 2017-01-15 ENCOUNTER — Encounter: Payer: Self-pay | Admitting: Cardiovascular Disease

## 2017-01-20 ENCOUNTER — Encounter (HOSPITAL_COMMUNITY)
Admission: RE | Admit: 2017-01-20 | Discharge: 2017-01-20 | Disposition: A | Discharge status: Home / Self Care | Payer: Self-pay | Source: Ambulatory Visit | Attending: Cardiovascular Disease | Admitting: Cardiovascular Disease

## 2017-01-20 DIAGNOSIS — Z955 Presence of coronary angioplasty implant and graft: Secondary | ICD-10-CM | POA: Insufficient documentation

## 2017-01-21 ENCOUNTER — Encounter (HOSPITAL_COMMUNITY)
Admission: RE | Admit: 2017-01-21 | Discharge: 2017-01-21 | Disposition: A | Discharge status: Home / Self Care | Payer: Self-pay | Source: Ambulatory Visit | Attending: Cardiovascular Disease | Admitting: Cardiovascular Disease

## 2017-01-22 ENCOUNTER — Encounter (HOSPITAL_COMMUNITY)
Admission: RE | Admit: 2017-01-22 | Discharge: 2017-01-22 | Disposition: A | Discharge status: Home / Self Care | Payer: Self-pay | Source: Ambulatory Visit | Attending: Cardiovascular Disease | Admitting: Cardiovascular Disease

## 2017-01-22 DIAGNOSIS — Z955 Presence of coronary angioplasty implant and graft: Secondary | ICD-10-CM | POA: Insufficient documentation

## 2017-01-26 ENCOUNTER — Encounter: Payer: Self-pay | Admitting: Cardiovascular Disease

## 2017-01-26 ENCOUNTER — Ambulatory Visit (INDEPENDENT_AMBULATORY_CARE_PROVIDER_SITE_OTHER): Payer: BLUE CROSS/BLUE SHIELD | Admitting: Cardiovascular Disease

## 2017-01-26 VITALS — BP 138/64 | HR 88 | Ht 74.0 in | Wt 245.1 lb

## 2017-01-26 DIAGNOSIS — I10 Essential (primary) hypertension: Secondary | ICD-10-CM

## 2017-01-26 DIAGNOSIS — I2510 Atherosclerotic heart disease of native coronary artery without angina pectoris: Secondary | ICD-10-CM

## 2017-01-26 NOTE — Progress Notes (Signed)
Chief Complaint  Patient presents with  . CAD in native artery     History of Present Illness: 61 yo male with history of DM, HTN, HLD, CAD who is here today for cardiac follow up. He was admitted to [facility] November 2017 with chest pain and dyspnea. He underwent cardiac cath 10/03/16 and was found to have severe stenosis in the proximal RCA, Diagonal and CTO of the OM branch. Drug eluting stents were placed in the RCA and Diagonal branches. LVEF=45-50%. Echo [id number] showed LV systolic was normal 0000000.   He is here today for follow up. He has been feeling well. No chest pain. Some dyspnea with maximal exertion.   Primary Care Physician: Irven Shelling, MD   Past Medical History:  Diagnosis Date  . Asthma   . Asymptomatic LV dysfunction 10/04/2016  . Benign essential HTN   . CAD in native artery 10/04/2016  . Complication of anesthesia    " DIFFICULTY BREATHING ONE TIME "  . Coronary artery disease   . Diabetes type 2, controlled ([place]) 10/04/2016  . DM (diabetes mellitus), type 2 ([place])   . Erectile dysfunction   . Hypertriglyceridemia   . Psoriatic arthritis ([place])   . Research study patient 10/04/2016  . Rotator cuff disorder   . S/P angioplasty with stent, 10/03/16 DES to [place] to Diag 10/04/2016  . Vitamin B 12 deficiency     Past Surgical History:  Procedure Laterality Date  . APPENDECTOMY  1999  . CARDIAC CATHETERIZATION N/A 10/03/2016   Procedure: Left Heart Cath and Coronary Angiography;  Surgeon: Burnell Blanks, MD;  Location: [place] [facility];  Service: Cardiovascular;  Laterality: N/A;  . CARDIAC CATHETERIZATION N/A 10/03/2016   Procedure: Coronary Stent Intervention;  Surgeon: Burnell Blanks, MD;  Location: [place] [facility];  Service: Cardiovascular;  Laterality: N/A;  . colon polyps    . CORONARY STENT PLACEMENT  10/03/2016   Severe stenosis proximal moderate caliber co-dominant RCA now s/p successful PTCA/DES x 1 proximal RCA.   Marland Kitchen  KNEE SURGERY Right 1975   ligament and spurs  . KNEE SURGERY Left 1980   ligaments and spurs  . TOTAL KNEE ARTHROPLASTY  1990's    Current Outpatient Prescriptions  Medication Sig Dispense Refill  . acetaminophen (TYLENOL) 325 MG tablet Take 2 tablets (650 mg total) by mouth every 4 (four) hours as needed for headache or mild pain.    Marland Kitchen albuterol (PROVENTIL HFA;VENTOLIN HFA) 108 (90 Base) MCG/ACT inhaler Inhale 2 puffs into the lungs every 4 (four) hours as needed for wheezing or shortness of breath.    . AMBULATORY NON FORMULARY MEDICATION Take 90 mg by mouth 2 (two) times daily. Medication Name: BRILINTA 90 mg BID (TWILIGHT Research Study PROVIDED)    . AMBULATORY NON FORMULARY MEDICATION Take 81 mg by mouth daily. Medication Name: Aspirin 81 mg Daily or PLACEBO (TWILIGHT Research study PROVIDED)    . amLODipine (NORVASC) 5 MG tablet Take 5 mg by mouth every evening.   1  . atorvastatin (LIPITOR) 80 MG tablet Take 1 tablet (80 mg total) by mouth daily at 6 PM. 30 tablet 6  . carvedilol (COREG) 12.5 MG tablet Take 1 tablet (12.5 mg total) by mouth 2 (two) times daily with a meal. 180 tablet 3  . DULoxetine (CYMBALTA) 60 MG capsule Take 60 mg by mouth daily.    . halobetasol (ULTRAVATE) 0.05 % cream Apply 1 application topically 2 (two) times daily as needed (  for psoriasis).     Marland Kitchen LANTUS SOLOSTAR 100 UNIT/ML Solostar Pen Inject 60 Units as directed at bedtime.   1  . losartan (COZAAR) 50 MG tablet Take 50 mg by mouth every evening.   1  . LYRICA 150 MG capsule Take 150 mg by mouth 2 (two) times daily.  0  . metFORMIN (GLUCOPHAGE-XR) 500 MG 24 hr tablet Take 2 tablets (1,000 mg total) by mouth 2 (two) times daily.  1  . nitroGLYCERIN (NITROSTAT) 0.4 MG SL tablet Place 1 tablet (0.4 mg total) under the tongue every 5 (five) minutes as needed for chest pain. 25 tablet 12  . traMADol (ULTRAM) 50 MG tablet Take 50 mg by mouth every 6 (six) hours as needed (for pain.).    Marland Kitchen TRULICITY 1.5 0000000  SOPN Inject 1.5 mg as directed every Sunday.   1   No current facility-administered medications for this visit.     Allergies  Allergen Reactions  . Gabapentin Other (See Comments)    Other reaction(s): sleepy sleepy  . Indocin [Indomethacin]     Other reaction(s): h/a's  . Penicillins Rash    Has patient had a PCN reaction causing immediate rash, facial/tongue/throat swelling, SOB or lightheadedness with hypotension:No Has patient had a PCN reaction causing severe rash involving mucus membranes or skin necrosis:No Has patient had a PCN reaction that required hospitalization:No Has patient had a PCN reaction occurring within the last 10 years:NO If all of the above answers are "NO", then may proceed with Cephalosporin use.     Social History   Social History  . Marital status: Married    Spouse name: N/A  . Number of children: N/A  . Years of education: N/A   Occupational History  . Not on file.   Social History Main Topics  . Smoking status: Never Smoker  . Smokeless tobacco: Never Used  . Alcohol use Yes     Comment: rarely  . Drug use: No  . Sexual activity: Not on file   Other Topics Concern  . Not on file   Social History Narrative  . No narrative on file    Family History  Problem Relation Age of Onset  . Heart disease Mother   . Hypertension Mother   . Breast cancer Mother   . Glaucoma Mother   . CAD Mother   . CVA Father   . Hypertension Father   . Neuropathy Father   . Other Father     carotid endarterectomy  . Hypertension Brother   . Diabetes Brother   . Arthritis Maternal Grandfather   . Healthy Son   . Healthy Son     Review of Systems:  As stated in the HPI and otherwise negative.   BP 138/64   Pulse 88   Ht 6\' 2"  (1.88 m)   Wt 245 lb 1.9 oz (111.2 kg)   BMI 31.47 kg/m   Physical Examination: General: Well developed, well nourished, NAD  HEENT: OP clear, mucus membranes moist  SKIN: warm, dry. No rashes. Neuro: No focal  deficits  Musculoskeletal: Muscle strength 5/5 all ext  Psychiatric: Mood and affect normal  Neck: No JVD, no carotid bruits, no thyromegaly, no lymphadenopathy.  Lungs:Clear bilaterally, no wheezes, rhonci, crackles Cardiovascular: Regular rate and rhythm. No murmurs, gallops or rubs. Abdomen:Soft. Bowel sounds present. Non-tender.  Extremities: No lower extremity edema. Pulses are 2 + in the bilateral DP/PT.  Echo 12/26/16: Left ventricle: The cavity size was normal. Wall thickness was  increased in a pattern of mild LVH. Systolic function was normal.   The estimated ejection fraction was in the range of 55% to 60%.   Wall motion was normal; there were no regional wall motion   abnormalities. Left ventricular diastolic function parameters   were normal. - Left atrium: The atrium was mildly dilated. - Atrial septum: No defect or patent foramen ovale was identified.  EKG:  EKG is not ordered today. The ekg ordered today demonstrates   Recent Labs: 10/04/2016: BUN 15; Creatinine, Ser 1.18; Hemoglobin 12.8; Platelets 270; Potassium 3.9; Sodium 140   Lipid Panel No results found for: CHOL, TRIG, HDL, CHOLHDL, VLDL, LDLCALC, LDLDIRECT   Wt Readings from Last 3 Encounters:  01/26/17 245 lb 1.9 oz (111.2 kg)  10/20/16 252 lb 12.8 oz (114.7 kg)  10/04/16 251 lb 5.2 oz (114 kg)     Other studies Reviewed: Additional studies/ records that were reviewed today include: . Review of the above records demonstrates:   Assessment and Plan:   1. CAD without angina: No chest pain suggestive of angina. Recent PCI November 2017 with 2 DES placed in the RCA. He is on good medical therapy with Brilinta (TWILIGHT study protocol), beta blocker, ARB and statin. Echo 12/26/16 with LVEF=55-60%. Will check lipids and LFTs.   2. HTN: BP is controlled. BP has been low per pt at rehab after recent increase in Losartan. Will decrease Losartan 50 mg po daily.   Current medicines are reviewed at length with  the patient today.  The patient does not have concerns regarding medicines.  The following changes have been made:  no change  Labs/ tests ordered today include:   Orders Placed This Encounter  Procedures  . Lipid Profile  . Hepatic function panel    Disposition:   FU with me in 6 months  Signed, Lauree Chandler, MD 01/26/2017 4:03 PM    [facility] [place], [place], [place]  [address] Phone: [phone]; Fax: [phone]

## 2017-01-26 NOTE — Patient Instructions (Signed)
Medication Instructions:  Your physician has recommended you make the following change in your medication:  Decrease losartan to 50 mg by mouth daily.     Labwork: Your physician recommends that you return for lab work on March 12,2018.  This will be fasting. The lab opens at 7:30 AM   Testing/Procedures: none  Follow-Up:  Your physician recommends that you schedule a follow-up appointment in: 6 months. Please call our office in about 3 months to schedule this appointment.    Any Other Special Instructions Will Be Listed Below (If Applicable).     If you need a refill on your cardiac medications before your next appointment, please call your pharmacy.

## 2017-01-27 ENCOUNTER — Encounter (HOSPITAL_COMMUNITY)
Admission: RE | Admit: 2017-01-27 | Discharge: 2017-01-27 | Disposition: A | Discharge status: Home / Self Care | Payer: Self-pay | Source: Ambulatory Visit | Attending: Cardiovascular Disease | Admitting: Cardiovascular Disease

## 2017-01-28 ENCOUNTER — Encounter (HOSPITAL_COMMUNITY)
Admission: RE | Admit: 2017-01-28 | Discharge: 2017-01-28 | Disposition: A | Discharge status: Home / Self Care | Payer: Self-pay | Source: Ambulatory Visit | Attending: Cardiovascular Disease | Admitting: Cardiovascular Disease

## 2017-01-28 ENCOUNTER — Telehealth (HOSPITAL_COMMUNITY): Payer: Self-pay | Admitting: Nurse Practitioner

## 2017-01-28 NOTE — Telephone Encounter (Signed)
Patient contacted for 3 month follow up for TWILIGHT clinical trial. Patient reports no issues at this time and is taking all medications as prescribed. Patient encouraged to follow up with research office and PCP if he has any questions or concerns.

## 2017-01-29 ENCOUNTER — Encounter (HOSPITAL_COMMUNITY)
Admission: RE | Admit: 2017-01-29 | Discharge: 2017-01-29 | Disposition: A | Discharge status: Home / Self Care | Payer: Self-pay | Source: Ambulatory Visit | Attending: Cardiovascular Disease | Admitting: Cardiovascular Disease

## 2017-02-02 ENCOUNTER — Other Ambulatory Visit: Payer: BLUE CROSS/BLUE SHIELD | Admitting: *Deleted

## 2017-02-02 DIAGNOSIS — I2510 Atherosclerotic heart disease of native coronary artery without angina pectoris: Secondary | ICD-10-CM

## 2017-02-02 LAB — LIPID PANEL
CHOLESTEROL TOTAL: 105 mg/dL (ref 100–199)
Chol/HDL Ratio: 3.1 ratio units (ref 0.0–5.0)
HDL: 34 mg/dL — AB (ref >39)
LDL Calculated: 45 mg/dL (ref 0–99)
TRIGLYCERIDES: 129 mg/dL (ref 0–149)
VLDL Cholesterol Cal: 26 mg/dL (ref 5–40)

## 2017-02-02 LAB — HEPATIC FUNCTION PANEL
ALBUMIN: 4.4 g/dL (ref 3.6–4.8)
ALK PHOS: 55 IU/L (ref 39–117)
ALT: 25 IU/L (ref 0–44)
AST: 15 IU/L (ref 0–40)
Bilirubin Total: 0.8 mg/dL (ref 0.0–1.2)
Bilirubin, Direct: 0.26 mg/dL (ref 0.00–0.40)
Total Protein: 6.4 g/dL (ref 6.0–8.5)

## 2017-02-03 ENCOUNTER — Encounter (HOSPITAL_COMMUNITY): Payer: Self-pay

## 2017-02-04 ENCOUNTER — Encounter (HOSPITAL_COMMUNITY)
Admission: RE | Admit: 2017-02-04 | Discharge: 2017-02-04 | Disposition: A | Discharge status: Home / Self Care | Payer: Self-pay | Source: Ambulatory Visit | Attending: Cardiovascular Disease | Admitting: Cardiovascular Disease

## 2017-02-05 ENCOUNTER — Encounter (HOSPITAL_COMMUNITY)
Admission: RE | Admit: 2017-02-05 | Discharge: 2017-02-05 | Disposition: A | Discharge status: Home / Self Care | Payer: Self-pay | Source: Ambulatory Visit | Attending: Cardiovascular Disease | Admitting: Cardiovascular Disease

## 2017-02-06 ENCOUNTER — Encounter (HOSPITAL_COMMUNITY)
Admission: RE | Admit: 2017-02-06 | Discharge: 2017-02-06 | Disposition: A | Discharge status: Home / Self Care | Payer: Self-pay | Source: Ambulatory Visit | Attending: Cardiovascular Disease | Admitting: Cardiovascular Disease

## 2017-02-10 ENCOUNTER — Encounter (HOSPITAL_COMMUNITY)
Admission: RE | Admit: 2017-02-10 | Discharge: 2017-02-10 | Disposition: A | Discharge status: Home / Self Care | Payer: Self-pay | Source: Ambulatory Visit | Attending: Cardiovascular Disease | Admitting: Cardiovascular Disease

## 2017-02-11 ENCOUNTER — Encounter (HOSPITAL_COMMUNITY)
Admission: RE | Admit: 2017-02-11 | Discharge: 2017-02-11 | Disposition: A | Discharge status: Home / Self Care | Payer: Self-pay | Source: Ambulatory Visit | Attending: Cardiovascular Disease | Admitting: Cardiovascular Disease

## 2017-02-12 ENCOUNTER — Encounter (HOSPITAL_COMMUNITY)
Admission: RE | Admit: 2017-02-12 | Discharge: 2017-02-12 | Disposition: A | Discharge status: Home / Self Care | Payer: Self-pay | Source: Ambulatory Visit | Attending: Cardiovascular Disease | Admitting: Cardiovascular Disease

## 2017-02-17 ENCOUNTER — Encounter (HOSPITAL_COMMUNITY): Payer: Self-pay

## 2017-02-18 ENCOUNTER — Encounter (HOSPITAL_COMMUNITY)
Admission: RE | Admit: 2017-02-18 | Discharge: 2017-02-18 | Disposition: A | Discharge status: Home / Self Care | Payer: Self-pay | Source: Ambulatory Visit | Attending: Cardiovascular Disease | Admitting: Cardiovascular Disease

## 2017-02-19 ENCOUNTER — Encounter (HOSPITAL_COMMUNITY)
Admission: RE | Admit: 2017-02-19 | Discharge: 2017-02-19 | Disposition: A | Discharge status: Home / Self Care | Payer: Self-pay | Source: Ambulatory Visit | Attending: Cardiovascular Disease | Admitting: Cardiovascular Disease

## 2017-02-24 ENCOUNTER — Encounter (HOSPITAL_COMMUNITY)
Admission: RE | Admit: 2017-02-24 | Discharge: 2017-02-24 | Disposition: A | Discharge status: Home / Self Care | Payer: BLUE CROSS/BLUE SHIELD | Source: Ambulatory Visit | Attending: Cardiovascular Disease | Admitting: Cardiovascular Disease

## 2017-02-24 DIAGNOSIS — Z955 Presence of coronary angioplasty implant and graft: Secondary | ICD-10-CM | POA: Insufficient documentation

## 2017-02-25 ENCOUNTER — Encounter (HOSPITAL_COMMUNITY)
Admission: RE | Admit: 2017-02-25 | Discharge: 2017-02-25 | Disposition: A | Discharge status: Home / Self Care | Payer: BLUE CROSS/BLUE SHIELD | Source: Ambulatory Visit | Attending: Cardiovascular Disease | Admitting: Cardiovascular Disease

## 2017-02-26 ENCOUNTER — Encounter (HOSPITAL_COMMUNITY)
Admission: RE | Admit: 2017-02-26 | Discharge: 2017-02-26 | Disposition: A | Discharge status: Home / Self Care | Payer: BLUE CROSS/BLUE SHIELD | Source: Ambulatory Visit | Attending: Cardiovascular Disease | Admitting: Cardiovascular Disease

## 2017-03-03 ENCOUNTER — Encounter (HOSPITAL_COMMUNITY)
Admission: RE | Admit: 2017-03-03 | Discharge: 2017-03-03 | Disposition: A | Discharge status: Home / Self Care | Payer: BLUE CROSS/BLUE SHIELD | Source: Ambulatory Visit | Attending: Cardiovascular Disease | Admitting: Cardiovascular Disease

## 2017-03-04 ENCOUNTER — Encounter (HOSPITAL_COMMUNITY)
Admission: RE | Admit: 2017-03-04 | Discharge: 2017-03-04 | Disposition: A | Discharge status: Home / Self Care | Payer: BLUE CROSS/BLUE SHIELD | Source: Ambulatory Visit | Attending: Cardiovascular Disease | Admitting: Cardiovascular Disease

## 2017-03-05 ENCOUNTER — Encounter (HOSPITAL_COMMUNITY): Payer: BLUE CROSS/BLUE SHIELD

## 2017-03-06 ENCOUNTER — Encounter (HOSPITAL_COMMUNITY)
Admission: RE | Admit: 2017-03-06 | Discharge: 2017-03-06 | Disposition: A | Discharge status: Home / Self Care | Payer: BLUE CROSS/BLUE SHIELD | Source: Ambulatory Visit | Attending: Cardiovascular Disease | Admitting: Cardiovascular Disease

## 2017-03-06 NOTE — Progress Notes (Signed)
Jermaine Jennings attended cardiac rehab maintenance for his exercise today. Blood pressure at check-in was initially 92/60, water given for hydration and BP rechecked at 95/62. After completing 2 stations of exercise pt needed to leave early for a meeting at work, BP at that time was 94/59, and pt c/o feeling lightheaded. Gatorade and banana given, recheck BP was 89/58 pt still lightheaded and c/o mild headache. Patient consulted with Jermaine Dredge, RN, and mentioned that he thought he was feeling poorly in part due to something he ate the previous days, and that he had some Mt Dew this morning because of how he was feeling. BP on recheck was 96/56 then 92/66.Continued to hydrate with Gatorade, graham crackers and peanut butter given. Pt felt better and final recheck BP was 98/64.   Jermaine Passer, MS, ACSM CEP

## 2017-03-10 ENCOUNTER — Encounter (HOSPITAL_COMMUNITY)
Admission: RE | Admit: 2017-03-10 | Discharge: 2017-03-10 | Disposition: A | Discharge status: Home / Self Care | Payer: BLUE CROSS/BLUE SHIELD | Source: Ambulatory Visit | Attending: Cardiovascular Disease | Admitting: Cardiovascular Disease

## 2017-03-11 ENCOUNTER — Encounter (HOSPITAL_COMMUNITY)
Admission: RE | Admit: 2017-03-11 | Discharge: 2017-03-11 | Disposition: A | Discharge status: Home / Self Care | Payer: BLUE CROSS/BLUE SHIELD | Source: Ambulatory Visit | Attending: Cardiovascular Disease | Admitting: Cardiovascular Disease

## 2017-03-12 ENCOUNTER — Encounter (HOSPITAL_COMMUNITY)
Admission: RE | Admit: 2017-03-12 | Discharge: 2017-03-12 | Disposition: A | Discharge status: Home / Self Care | Payer: BLUE CROSS/BLUE SHIELD | Source: Ambulatory Visit | Attending: Cardiovascular Disease | Admitting: Cardiovascular Disease

## 2017-03-13 DIAGNOSIS — I10 Essential (primary) hypertension: Secondary | ICD-10-CM | POA: Diagnosis not present

## 2017-03-13 DIAGNOSIS — E1142 Type 2 diabetes mellitus with diabetic polyneuropathy: Secondary | ICD-10-CM | POA: Diagnosis not present

## 2017-03-13 DIAGNOSIS — G608 Other hereditary and idiopathic neuropathies: Secondary | ICD-10-CM | POA: Diagnosis not present

## 2017-03-13 DIAGNOSIS — I2510 Atherosclerotic heart disease of native coronary artery without angina pectoris: Secondary | ICD-10-CM | POA: Diagnosis not present

## 2017-03-16 DIAGNOSIS — E538 Deficiency of other specified B group vitamins: Secondary | ICD-10-CM | POA: Diagnosis not present

## 2017-03-17 ENCOUNTER — Encounter (HOSPITAL_COMMUNITY)
Admission: RE | Admit: 2017-03-17 | Discharge: 2017-03-17 | Disposition: A | Discharge status: Home / Self Care | Payer: BLUE CROSS/BLUE SHIELD | Source: Ambulatory Visit | Attending: Cardiovascular Disease | Admitting: Cardiovascular Disease

## 2017-03-18 ENCOUNTER — Encounter (HOSPITAL_COMMUNITY)
Admission: RE | Admit: 2017-03-18 | Discharge: 2017-03-18 | Disposition: A | Discharge status: Home / Self Care | Payer: BLUE CROSS/BLUE SHIELD | Source: Ambulatory Visit | Attending: Cardiovascular Disease | Admitting: Cardiovascular Disease

## 2017-03-19 ENCOUNTER — Encounter (HOSPITAL_COMMUNITY)
Admission: RE | Admit: 2017-03-19 | Discharge: 2017-03-19 | Disposition: A | Discharge status: Home / Self Care | Payer: BLUE CROSS/BLUE SHIELD | Source: Ambulatory Visit | Attending: Cardiovascular Disease | Admitting: Cardiovascular Disease

## 2017-03-24 ENCOUNTER — Encounter (HOSPITAL_COMMUNITY)
Admission: RE | Admit: 2017-03-24 | Discharge: 2017-03-24 | Disposition: A | Discharge status: Home / Self Care | Payer: BLUE CROSS/BLUE SHIELD | Source: Ambulatory Visit | Attending: Cardiovascular Disease | Admitting: Cardiovascular Disease

## 2017-03-24 DIAGNOSIS — Z955 Presence of coronary angioplasty implant and graft: Secondary | ICD-10-CM | POA: Insufficient documentation

## 2017-03-25 ENCOUNTER — Encounter (HOSPITAL_COMMUNITY)
Admission: RE | Admit: 2017-03-25 | Discharge: 2017-03-25 | Disposition: A | Discharge status: Home / Self Care | Payer: BLUE CROSS/BLUE SHIELD | Source: Ambulatory Visit | Attending: Cardiovascular Disease | Admitting: Cardiovascular Disease

## 2017-03-25 DIAGNOSIS — Z955 Presence of coronary angioplasty implant and graft: Secondary | ICD-10-CM | POA: Diagnosis not present

## 2017-03-26 ENCOUNTER — Encounter (HOSPITAL_COMMUNITY)
Admission: RE | Admit: 2017-03-26 | Discharge: 2017-03-26 | Disposition: A | Discharge status: Home / Self Care | Payer: BLUE CROSS/BLUE SHIELD | Source: Ambulatory Visit | Attending: Cardiovascular Disease | Admitting: Cardiovascular Disease

## 2017-03-31 ENCOUNTER — Encounter (HOSPITAL_COMMUNITY)
Admission: RE | Admit: 2017-03-31 | Discharge: 2017-03-31 | Disposition: A | Discharge status: Home / Self Care | Payer: BLUE CROSS/BLUE SHIELD | Source: Ambulatory Visit | Attending: Cardiovascular Disease | Admitting: Cardiovascular Disease

## 2017-04-01 ENCOUNTER — Encounter (HOSPITAL_COMMUNITY)
Admission: RE | Admit: 2017-04-01 | Discharge: 2017-04-01 | Disposition: A | Discharge status: Home / Self Care | Payer: BLUE CROSS/BLUE SHIELD | Source: Ambulatory Visit | Attending: Cardiovascular Disease | Admitting: Cardiovascular Disease

## 2017-04-02 ENCOUNTER — Encounter (HOSPITAL_COMMUNITY)
Admission: RE | Admit: 2017-04-02 | Discharge: 2017-04-02 | Disposition: A | Discharge status: Home / Self Care | Payer: BLUE CROSS/BLUE SHIELD | Source: Ambulatory Visit | Attending: Cardiovascular Disease | Admitting: Cardiovascular Disease

## 2017-04-07 ENCOUNTER — Encounter (HOSPITAL_COMMUNITY)
Admission: RE | Admit: 2017-04-07 | Discharge: 2017-04-07 | Disposition: A | Discharge status: Home / Self Care | Payer: BLUE CROSS/BLUE SHIELD | Source: Ambulatory Visit | Attending: Cardiovascular Disease | Admitting: Cardiovascular Disease

## 2017-04-08 ENCOUNTER — Encounter (HOSPITAL_COMMUNITY): Payer: BLUE CROSS/BLUE SHIELD

## 2017-04-09 ENCOUNTER — Encounter (HOSPITAL_COMMUNITY)
Admission: RE | Admit: 2017-04-09 | Discharge: 2017-04-09 | Disposition: A | Discharge status: Home / Self Care | Payer: BLUE CROSS/BLUE SHIELD | Source: Ambulatory Visit | Attending: Cardiovascular Disease | Admitting: Cardiovascular Disease

## 2017-04-10 ENCOUNTER — Encounter (HOSPITAL_COMMUNITY)
Admission: RE | Admit: 2017-04-10 | Discharge: 2017-04-10 | Disposition: A | Discharge status: Home / Self Care | Payer: BLUE CROSS/BLUE SHIELD | Source: Ambulatory Visit | Attending: Cardiovascular Disease | Admitting: Cardiovascular Disease

## 2017-04-11 ENCOUNTER — Ambulatory Visit: Payer: Self-pay | Admitting: Family Medicine

## 2017-04-14 ENCOUNTER — Encounter (HOSPITAL_COMMUNITY): Payer: BLUE CROSS/BLUE SHIELD

## 2017-04-15 ENCOUNTER — Encounter (HOSPITAL_COMMUNITY)
Admission: RE | Admit: 2017-04-15 | Discharge: 2017-04-15 | Disposition: A | Discharge status: Home / Self Care | Payer: BLUE CROSS/BLUE SHIELD | Source: Ambulatory Visit | Attending: Cardiovascular Disease | Admitting: Cardiovascular Disease

## 2017-04-16 ENCOUNTER — Encounter (HOSPITAL_COMMUNITY)
Admission: RE | Admit: 2017-04-16 | Discharge: 2017-04-16 | Disposition: A | Discharge status: Home / Self Care | Payer: BLUE CROSS/BLUE SHIELD | Source: Ambulatory Visit | Attending: Cardiovascular Disease | Admitting: Cardiovascular Disease

## 2017-04-16 DIAGNOSIS — E538 Deficiency of other specified B group vitamins: Secondary | ICD-10-CM | POA: Diagnosis not present

## 2017-04-16 DIAGNOSIS — Z23 Encounter for immunization: Secondary | ICD-10-CM | POA: Diagnosis not present

## 2017-04-17 ENCOUNTER — Encounter (HOSPITAL_COMMUNITY)
Admission: RE | Admit: 2017-04-17 | Discharge: 2017-04-17 | Disposition: A | Discharge status: Home / Self Care | Payer: BLUE CROSS/BLUE SHIELD | Source: Ambulatory Visit | Attending: Cardiovascular Disease | Admitting: Cardiovascular Disease

## 2017-04-21 ENCOUNTER — Encounter (HOSPITAL_COMMUNITY): Payer: BLUE CROSS/BLUE SHIELD

## 2017-04-22 ENCOUNTER — Encounter (HOSPITAL_COMMUNITY)
Admission: RE | Admit: 2017-04-22 | Discharge: 2017-04-22 | Disposition: A | Discharge status: Home / Self Care | Payer: BLUE CROSS/BLUE SHIELD | Source: Ambulatory Visit | Attending: Cardiovascular Disease | Admitting: Cardiovascular Disease

## 2017-04-23 ENCOUNTER — Encounter (HOSPITAL_COMMUNITY)
Admission: RE | Admit: 2017-04-23 | Discharge: 2017-04-23 | Disposition: A | Discharge status: Home / Self Care | Payer: BLUE CROSS/BLUE SHIELD | Source: Ambulatory Visit | Attending: Cardiovascular Disease | Admitting: Cardiovascular Disease

## 2017-04-24 ENCOUNTER — Encounter (HOSPITAL_COMMUNITY)
Admission: RE | Admit: 2017-04-24 | Discharge: 2017-04-24 | Disposition: A | Discharge status: Home / Self Care | Payer: Self-pay | Source: Ambulatory Visit | Attending: Cardiovascular Disease | Admitting: Cardiovascular Disease

## 2017-04-24 DIAGNOSIS — Z0000 Encounter for general adult medical examination without abnormal findings: Secondary | ICD-10-CM | POA: Diagnosis not present

## 2017-04-24 DIAGNOSIS — Z955 Presence of coronary angioplasty implant and graft: Secondary | ICD-10-CM | POA: Insufficient documentation

## 2017-04-28 ENCOUNTER — Encounter (HOSPITAL_COMMUNITY)
Admission: RE | Admit: 2017-04-28 | Discharge: 2017-04-28 | Disposition: A | Discharge status: Home / Self Care | Payer: Self-pay | Source: Ambulatory Visit | Attending: Cardiovascular Disease | Admitting: Cardiovascular Disease

## 2017-04-29 ENCOUNTER — Encounter (HOSPITAL_COMMUNITY): Payer: Self-pay

## 2017-04-29 DIAGNOSIS — S29012A Strain of muscle and tendon of back wall of thorax, initial encounter: Secondary | ICD-10-CM | POA: Diagnosis not present

## 2017-04-30 ENCOUNTER — Encounter (HOSPITAL_COMMUNITY): Payer: Self-pay

## 2017-05-05 ENCOUNTER — Encounter (HOSPITAL_COMMUNITY)
Admission: RE | Admit: 2017-05-05 | Discharge: 2017-05-05 | Disposition: A | Discharge status: Home / Self Care | Payer: Self-pay | Source: Ambulatory Visit | Attending: Cardiovascular Disease | Admitting: Cardiovascular Disease

## 2017-05-06 ENCOUNTER — Encounter (HOSPITAL_COMMUNITY)
Admission: RE | Admit: 2017-05-06 | Discharge: 2017-05-06 | Disposition: A | Discharge status: Home / Self Care | Payer: Self-pay | Source: Ambulatory Visit | Attending: Cardiovascular Disease | Admitting: Cardiovascular Disease

## 2017-05-06 ENCOUNTER — Encounter: Payer: Self-pay | Admitting: Cardiovascular Disease

## 2017-05-06 ENCOUNTER — Other Ambulatory Visit: Payer: Self-pay | Admitting: Cardiology

## 2017-05-06 NOTE — Telephone Encounter (Signed)
Spoke with Pt.  Pt has not heard back from his PCP yet regarding medication changes made and Pt with increase BP/HR.  Will forward to [facility] for review.

## 2017-05-07 ENCOUNTER — Encounter (HOSPITAL_COMMUNITY)
Admission: RE | Admit: 2017-05-07 | Discharge: 2017-05-07 | Disposition: A | Discharge status: Home / Self Care | Payer: Self-pay | Source: Ambulatory Visit | Attending: Cardiovascular Disease | Admitting: Cardiovascular Disease

## 2017-05-08 DIAGNOSIS — I10 Essential (primary) hypertension: Secondary | ICD-10-CM | POA: Diagnosis not present

## 2017-05-08 DIAGNOSIS — I2510 Atherosclerotic heart disease of native coronary artery without angina pectoris: Secondary | ICD-10-CM | POA: Diagnosis not present

## 2017-05-12 ENCOUNTER — Encounter (HOSPITAL_COMMUNITY): Payer: Self-pay

## 2017-05-13 ENCOUNTER — Encounter (HOSPITAL_COMMUNITY): Payer: Self-pay

## 2017-05-14 ENCOUNTER — Encounter (HOSPITAL_COMMUNITY): Payer: Self-pay

## 2017-05-19 ENCOUNTER — Encounter (HOSPITAL_COMMUNITY): Payer: Self-pay

## 2017-05-20 ENCOUNTER — Encounter (HOSPITAL_COMMUNITY): Payer: Self-pay

## 2017-05-21 ENCOUNTER — Encounter (HOSPITAL_COMMUNITY): Payer: Self-pay

## 2017-05-26 ENCOUNTER — Encounter (HOSPITAL_COMMUNITY)
Admission: RE | Admit: 2017-05-26 | Discharge: 2017-05-26 | Disposition: A | Discharge status: Home / Self Care | Payer: BLUE CROSS/BLUE SHIELD | Source: Ambulatory Visit | Attending: Cardiovascular Disease | Admitting: Cardiovascular Disease

## 2017-05-26 DIAGNOSIS — Z955 Presence of coronary angioplasty implant and graft: Secondary | ICD-10-CM | POA: Insufficient documentation

## 2017-05-28 ENCOUNTER — Encounter (HOSPITAL_COMMUNITY)
Admission: RE | Admit: 2017-05-28 | Discharge: 2017-05-28 | Disposition: A | Discharge status: Home / Self Care | Payer: BLUE CROSS/BLUE SHIELD | Source: Ambulatory Visit | Attending: Cardiovascular Disease | Admitting: Cardiovascular Disease

## 2017-05-28 DIAGNOSIS — E538 Deficiency of other specified B group vitamins: Secondary | ICD-10-CM | POA: Diagnosis not present

## 2017-05-28 DIAGNOSIS — Z23 Encounter for immunization: Secondary | ICD-10-CM | POA: Diagnosis not present

## 2017-06-02 ENCOUNTER — Encounter (HOSPITAL_COMMUNITY)
Admission: RE | Admit: 2017-06-02 | Discharge: 2017-06-02 | Disposition: A | Discharge status: Home / Self Care | Payer: BLUE CROSS/BLUE SHIELD | Source: Ambulatory Visit | Attending: Cardiovascular Disease | Admitting: Cardiovascular Disease

## 2017-06-03 ENCOUNTER — Encounter (HOSPITAL_COMMUNITY)
Admission: RE | Admit: 2017-06-03 | Discharge: 2017-06-03 | Disposition: A | Discharge status: Home / Self Care | Payer: BLUE CROSS/BLUE SHIELD | Source: Ambulatory Visit | Attending: Cardiovascular Disease | Admitting: Cardiovascular Disease

## 2017-06-03 DIAGNOSIS — E119 Type 2 diabetes mellitus without complications: Secondary | ICD-10-CM | POA: Diagnosis not present

## 2017-06-04 ENCOUNTER — Encounter (HOSPITAL_COMMUNITY)
Admission: RE | Admit: 2017-06-04 | Discharge: 2017-06-04 | Disposition: A | Discharge status: Home / Self Care | Payer: BLUE CROSS/BLUE SHIELD | Source: Ambulatory Visit | Attending: Cardiovascular Disease | Admitting: Cardiovascular Disease

## 2017-06-09 ENCOUNTER — Encounter (HOSPITAL_COMMUNITY)
Admission: RE | Admit: 2017-06-09 | Discharge: 2017-06-09 | Disposition: A | Discharge status: Home / Self Care | Payer: BLUE CROSS/BLUE SHIELD | Source: Ambulatory Visit | Attending: Cardiovascular Disease | Admitting: Cardiovascular Disease

## 2017-06-10 ENCOUNTER — Encounter (HOSPITAL_COMMUNITY)
Admission: RE | Admit: 2017-06-10 | Discharge: 2017-06-10 | Disposition: A | Discharge status: Home / Self Care | Payer: BLUE CROSS/BLUE SHIELD | Source: Ambulatory Visit | Attending: Cardiovascular Disease | Admitting: Cardiovascular Disease

## 2017-06-11 ENCOUNTER — Encounter (HOSPITAL_COMMUNITY): Payer: BLUE CROSS/BLUE SHIELD

## 2017-06-12 ENCOUNTER — Encounter (HOSPITAL_COMMUNITY)
Admission: RE | Admit: 2017-06-12 | Discharge: 2017-06-12 | Disposition: A | Discharge status: Home / Self Care | Payer: BLUE CROSS/BLUE SHIELD | Source: Ambulatory Visit | Attending: Cardiovascular Disease | Admitting: Cardiovascular Disease

## 2017-06-16 ENCOUNTER — Encounter (HOSPITAL_COMMUNITY): Payer: BLUE CROSS/BLUE SHIELD

## 2017-06-16 DIAGNOSIS — G608 Other hereditary and idiopathic neuropathies: Secondary | ICD-10-CM | POA: Diagnosis not present

## 2017-06-16 DIAGNOSIS — E1165 Type 2 diabetes mellitus with hyperglycemia: Secondary | ICD-10-CM | POA: Diagnosis not present

## 2017-06-16 DIAGNOSIS — I10 Essential (primary) hypertension: Secondary | ICD-10-CM | POA: Diagnosis not present

## 2017-06-16 DIAGNOSIS — I2510 Atherosclerotic heart disease of native coronary artery without angina pectoris: Secondary | ICD-10-CM | POA: Diagnosis not present

## 2017-06-16 DIAGNOSIS — E1142 Type 2 diabetes mellitus with diabetic polyneuropathy: Secondary | ICD-10-CM | POA: Diagnosis not present

## 2017-06-17 ENCOUNTER — Encounter (HOSPITAL_COMMUNITY)
Admission: RE | Admit: 2017-06-17 | Discharge: 2017-06-17 | Disposition: A | Discharge status: Home / Self Care | Payer: BLUE CROSS/BLUE SHIELD | Source: Ambulatory Visit | Attending: Cardiovascular Disease | Admitting: Cardiovascular Disease

## 2017-06-18 ENCOUNTER — Encounter (HOSPITAL_COMMUNITY)
Admission: RE | Admit: 2017-06-18 | Discharge: 2017-06-18 | Disposition: A | Discharge status: Home / Self Care | Payer: BLUE CROSS/BLUE SHIELD | Source: Ambulatory Visit | Attending: Cardiovascular Disease | Admitting: Cardiovascular Disease

## 2017-06-19 ENCOUNTER — Encounter (HOSPITAL_COMMUNITY)
Admission: RE | Admit: 2017-06-19 | Discharge: 2017-06-19 | Disposition: A | Discharge status: Home / Self Care | Payer: Self-pay | Source: Ambulatory Visit | Attending: Cardiovascular Disease | Admitting: Cardiovascular Disease

## 2017-06-23 ENCOUNTER — Encounter (HOSPITAL_COMMUNITY)
Admission: RE | Admit: 2017-06-23 | Discharge: 2017-06-23 | Disposition: A | Discharge status: Home / Self Care | Payer: Self-pay | Source: Ambulatory Visit | Attending: Cardiovascular Disease | Admitting: Cardiovascular Disease

## 2017-06-24 ENCOUNTER — Encounter (HOSPITAL_COMMUNITY)
Admission: RE | Admit: 2017-06-24 | Discharge: 2017-06-24 | Disposition: A | Discharge status: Home / Self Care | Payer: Self-pay | Source: Ambulatory Visit | Attending: Cardiovascular Disease | Admitting: Cardiovascular Disease

## 2017-06-24 DIAGNOSIS — Z955 Presence of coronary angioplasty implant and graft: Secondary | ICD-10-CM | POA: Insufficient documentation

## 2017-06-25 ENCOUNTER — Encounter (HOSPITAL_COMMUNITY): Payer: Self-pay

## 2017-06-30 ENCOUNTER — Encounter (HOSPITAL_COMMUNITY)
Admission: RE | Admit: 2017-06-30 | Discharge: 2017-06-30 | Disposition: A | Discharge status: Home / Self Care | Payer: Self-pay | Source: Ambulatory Visit | Attending: Cardiovascular Disease | Admitting: Cardiovascular Disease

## 2017-07-01 ENCOUNTER — Encounter (HOSPITAL_COMMUNITY)
Admission: RE | Admit: 2017-07-01 | Discharge: 2017-07-01 | Disposition: A | Discharge status: Home / Self Care | Payer: Self-pay | Source: Ambulatory Visit | Attending: Cardiovascular Disease | Admitting: Cardiovascular Disease

## 2017-07-02 ENCOUNTER — Encounter (HOSPITAL_COMMUNITY): Payer: Self-pay

## 2017-07-03 ENCOUNTER — Encounter (HOSPITAL_COMMUNITY)
Admission: RE | Admit: 2017-07-03 | Discharge: 2017-07-03 | Disposition: A | Discharge status: Home / Self Care | Payer: Self-pay | Source: Ambulatory Visit | Attending: Cardiovascular Disease | Admitting: Cardiovascular Disease

## 2017-07-07 ENCOUNTER — Encounter (HOSPITAL_COMMUNITY)
Admission: RE | Admit: 2017-07-07 | Discharge: 2017-07-07 | Disposition: A | Discharge status: Home / Self Care | Payer: Self-pay | Source: Ambulatory Visit | Attending: Cardiovascular Disease | Admitting: Cardiovascular Disease

## 2017-07-08 ENCOUNTER — Encounter (HOSPITAL_COMMUNITY)
Admission: RE | Admit: 2017-07-08 | Discharge: 2017-07-08 | Disposition: A | Discharge status: Home / Self Care | Payer: Self-pay | Source: Ambulatory Visit | Attending: Cardiovascular Disease | Admitting: Cardiovascular Disease

## 2017-07-09 ENCOUNTER — Encounter (HOSPITAL_COMMUNITY): Payer: Self-pay

## 2017-07-14 ENCOUNTER — Encounter: Payer: Self-pay | Admitting: *Deleted

## 2017-07-14 ENCOUNTER — Encounter (HOSPITAL_COMMUNITY)
Admission: RE | Admit: 2017-07-14 | Discharge: 2017-07-14 | Disposition: A | Discharge status: Home / Self Care | Payer: Self-pay | Source: Ambulatory Visit | Attending: Cardiovascular Disease | Admitting: Cardiovascular Disease

## 2017-07-14 DIAGNOSIS — Z006 Encounter for examination for normal comparison and control in clinical research program: Secondary | ICD-10-CM

## 2017-07-14 NOTE — Progress Notes (Signed)
TWILIGHT Research study month 9 follow up visit completed. Patient states he has been complainant with both ASA/Placebo and Brilinta, however his did not return pills for a pill count. He has cardiac rehab to tomorrow and will bring unused pills. The following pill bottles were dispensed: [id number]; [id number]; [id number].He denies any adverse events. Next research required visit visit is due no later han 05/MAR/2019. At that appointment he will no longer receive ASA/Placebo and Brilinta. Further antiplatelet therapy will be at the discretion of his cardiologist. Questions encouraged and answered.

## 2017-07-15 ENCOUNTER — Encounter (HOSPITAL_COMMUNITY): Payer: Self-pay

## 2017-07-16 ENCOUNTER — Encounter (HOSPITAL_COMMUNITY)
Admission: RE | Admit: 2017-07-16 | Discharge: 2017-07-16 | Disposition: A | Discharge status: Home / Self Care | Payer: Self-pay | Source: Ambulatory Visit | Attending: Cardiovascular Disease | Admitting: Cardiovascular Disease

## 2017-07-17 ENCOUNTER — Encounter (HOSPITAL_COMMUNITY)
Admission: RE | Admit: 2017-07-17 | Discharge: 2017-07-17 | Disposition: A | Discharge status: Home / Self Care | Payer: Self-pay | Source: Ambulatory Visit | Attending: Cardiovascular Disease | Admitting: Cardiovascular Disease

## 2017-07-21 ENCOUNTER — Encounter (HOSPITAL_COMMUNITY)
Admission: RE | Admit: 2017-07-21 | Discharge: 2017-07-21 | Disposition: A | Discharge status: Home / Self Care | Payer: Self-pay | Source: Ambulatory Visit | Attending: Cardiovascular Disease | Admitting: Cardiovascular Disease

## 2017-07-22 ENCOUNTER — Encounter (HOSPITAL_COMMUNITY): Payer: Self-pay

## 2017-07-23 ENCOUNTER — Encounter (HOSPITAL_COMMUNITY)
Admission: RE | Admit: 2017-07-23 | Discharge: 2017-07-23 | Disposition: A | Discharge status: Home / Self Care | Payer: Self-pay | Source: Ambulatory Visit | Attending: Cardiovascular Disease | Admitting: Cardiovascular Disease

## 2017-07-28 ENCOUNTER — Encounter (HOSPITAL_COMMUNITY)
Admission: RE | Admit: 2017-07-28 | Discharge: 2017-07-28 | Disposition: A | Discharge status: Home / Self Care | Payer: Self-pay | Source: Ambulatory Visit | Attending: Cardiovascular Disease | Admitting: Cardiovascular Disease

## 2017-07-28 DIAGNOSIS — Z48812 Encounter for surgical aftercare following surgery on the circulatory system: Secondary | ICD-10-CM | POA: Insufficient documentation

## 2017-07-28 DIAGNOSIS — Z955 Presence of coronary angioplasty implant and graft: Secondary | ICD-10-CM | POA: Insufficient documentation

## 2017-07-29 ENCOUNTER — Ambulatory Visit (INDEPENDENT_AMBULATORY_CARE_PROVIDER_SITE_OTHER): Payer: BLUE CROSS/BLUE SHIELD | Admitting: Cardiovascular Disease

## 2017-07-29 ENCOUNTER — Encounter: Payer: Self-pay | Admitting: Cardiovascular Disease

## 2017-07-29 VITALS — BP 138/76 | HR 102 | Ht 74.0 in | Wt 241.8 lb

## 2017-07-29 DIAGNOSIS — I2510 Atherosclerotic heart disease of native coronary artery without angina pectoris: Secondary | ICD-10-CM

## 2017-07-29 DIAGNOSIS — I10 Essential (primary) hypertension: Secondary | ICD-10-CM

## 2017-07-29 NOTE — Patient Instructions (Signed)

## 2017-07-29 NOTE — Progress Notes (Signed)
Chief Complaint  Patient presents with  . Follow-up    CAD     History of Present Illness: 61 yo male with history of DM, HTN, HLD, CAD who is here today for cardiac follow up. He was admitted to [facility] November 2017 with unstable angina. He underwent cardiac cath 10/03/16 and was found to have severe stenosis in the proximal RCA, Diagonal and CTO of the OM branch. Drug eluting stents were placed in the RCA and Diagonal branches. LVEF=45-50%. Echo 0/7/37 showed LV systolic was normal 10-62%.   He is here today for follow up. The patient denies any chest pain, dyspnea, palpitations, lower extremity edema, orthopnea, PND, dizziness, near syncope or syncope. He has had BP on the lower side when starting cardiac rehab. Some blood pressures are in the 90 systolic range. Coreg reduced to 6.25 mg po BID and Cozaar reduced to ? 25 mg daily in primary care. He continues to have periods of hypotension despite this change. NO near syncope or syncope.   Primary Care Physician: Lavone Orn, MD  Past Medical History:  Diagnosis Date  . Asthma   . Asymptomatic LV dysfunction 10/04/2016  . Benign essential HTN   . CAD in native artery 10/04/2016  . Complication of anesthesia    " DIFFICULTY BREATHING ONE TIME "  . Coronary artery disease   . Diabetes type 2, controlled ([place]) 10/04/2016  . DM (diabetes mellitus), type 2 ([place])   . Erectile dysfunction   . Hypertriglyceridemia   . Psoriatic arthritis ([place])   . Research study patient 10/04/2016  . Rotator cuff disorder   . S/P angioplasty with stent, 10/03/16 DES to [place] to Diag 10/04/2016  . Vitamin B 12 deficiency     Past Surgical History:  Procedure Laterality Date  . APPENDECTOMY  1999  . CARDIAC CATHETERIZATION N/A 10/03/2016   Procedure: Left Heart Cath and Coronary Angiography;  Surgeon: Burnell Blanks, MD;  Location: [facility];  Service: Cardiovascular;  Laterality: N/A;  . CARDIAC CATHETERIZATION N/A 10/03/2016    Procedure: Coronary Stent Intervention;  Surgeon: Burnell Blanks, MD;  Location: [facility];  Service: Cardiovascular;  Laterality: N/A;  . colon polyps    . CORONARY STENT PLACEMENT  10/03/2016   Severe stenosis proximal moderate caliber co-dominant RCA now s/p successful PTCA/DES x 1 proximal RCA.   Marland Kitchen KNEE SURGERY Right 1975   ligament and spurs  . KNEE SURGERY Left 1980   ligaments and spurs  . TOTAL KNEE ARTHROPLASTY  1990's    Current Outpatient Prescriptions  Medication Sig Dispense Refill  . acetaminophen (TYLENOL) 325 MG tablet Take 2 tablets (650 mg total) by mouth every 4 (four) hours as needed for headache or mild pain.    Marland Kitchen albuterol (PROVENTIL HFA;VENTOLIN HFA) 108 (90 Base) MCG/ACT inhaler Inhale 2 puffs into the lungs every 4 (four) hours as needed for wheezing or shortness of breath.    . AMBULATORY NON FORMULARY MEDICATION Take 90 mg by mouth 2 (two) times daily. Medication Name: BRILINTA 90 mg BID (TWILIGHT Research Study PROVIDED)    . AMBULATORY NON FORMULARY MEDICATION Take 81 mg by mouth daily. Medication Name: Aspirin 81 mg Daily or PLACEBO (TWILIGHT Research study PROVIDED)    . atorvastatin (LIPITOR) 80 MG tablet take 1 tablet by mouth once daily AT 6PM 30 tablet 4  . carvedilol (COREG) 6.25 MG tablet Take 6.25 mg by mouth 2 (two) times daily with a meal.    .  DULoxetine (CYMBALTA) 60 MG capsule Take 60 mg by mouth daily.    . halobetasol (ULTRAVATE) 0.05 % cream Apply 1 application topically 2 (two) times daily as needed (for psoriasis).     Marland Kitchen LANTUS SOLOSTAR 100 UNIT/ML Solostar Pen Inject 70 Units as directed at bedtime.   1  . losartan (COZAAR) 50 MG tablet Take 50 mg by mouth every evening.   1  . LYRICA 150 MG capsule Take 150 mg by mouth 2 (two) times daily.  0  . metFORMIN (GLUCOPHAGE-XR) 500 MG 24 hr tablet Take 2 tablets (1,000 mg total) by mouth 2 (two) times daily.  1  . nitroGLYCERIN (NITROSTAT) 0.4 MG SL tablet Place 1 tablet (0.4 mg  total) under the tongue every 5 (five) minutes as needed for chest pain. 25 tablet 12  . tamsulosin (FLOMAX) 0.4 MG CAPS capsule Take 0.4 mg by mouth every morning.    . traMADol (ULTRAM) 50 MG tablet Take 50 mg by mouth every 6 (six) hours as needed (for pain.).    Marland Kitchen TRULICITY 1.5 AL/9.3XT SOPN Inject 1.5 mg as directed every Sunday.   1   No current facility-administered medications for this visit.     Allergies  Allergen Reactions  . Gabapentin Other (See Comments)    Other reaction(s): sleepy sleepy  . Indocin [Indomethacin]     Other reaction(s): h/a's  . Penicillins Rash    Has patient had a PCN reaction causing immediate rash, facial/tongue/throat swelling, SOB or lightheadedness with hypotension:No Has patient had a PCN reaction causing severe rash involving mucus membranes or skin necrosis:No Has patient had a PCN reaction that required hospitalization:No Has patient had a PCN reaction occurring within the last 10 years:NO If all of the above answers are "NO", then may proceed with Cephalosporin use.     Social History   Social History  . Marital status: Married    Spouse name: N/A  . Number of children: N/A  . Years of education: N/A   Occupational History  . Not on file.   Social History Main Topics  . Smoking status: Never Smoker  . Smokeless tobacco: Never Used  . Alcohol use Yes     Comment: rarely  . Drug use: No  . Sexual activity: Not on file   Other Topics Concern  . Not on file   Social History Narrative  . No narrative on file    Family History  Problem Relation Age of Onset  . Heart disease Mother   . Hypertension Mother   . Breast cancer Mother   . Glaucoma Mother   . CAD Mother   . CVA Father   . Hypertension Father   . Neuropathy Father   . Other Father        carotid endarterectomy  . Hypertension Brother   . Diabetes Brother   . Arthritis Maternal Grandfather   . Healthy Son   . Healthy Son     Review of Systems:  As  stated in the HPI and otherwise negative.   BP 138/76   Pulse (!) 102   Ht 6\' 2"  (1.88 m)   Wt 241 lb 12.8 oz (109.7 kg)   SpO2 96%   BMI 31.05 kg/m   Physical Examination:  General: Well developed, well nourished, NAD  HEENT: OP clear, mucus membranes moist  SKIN: warm, dry. No rashes. Neuro: No focal deficits  Musculoskeletal: Muscle strength 5/5 all ext  Psychiatric: Mood and affect normal  Neck: No JVD,  no carotid bruits, no thyromegaly, no lymphadenopathy.  Lungs:Clear bilaterally, no wheezes, rhonci, crackles Cardiovascular: Regular rate and rhythm. No murmurs, gallops or rubs. Abdomen:Soft. Bowel sounds present. Non-tender.  Extremities: No lower extremity edema. Pulses are 2 + in the bilateral DP/PT.  Echo 12/26/16: Left ventricle: The cavity size was normal. Wall thickness was   increased in a pattern of mild LVH. Systolic function was normal.   The estimated ejection fraction was in the range of 55% to 60%.   Wall motion was normal; there were no regional wall motion   abnormalities. Left ventricular diastolic function parameters   were normal. - Left atrium: The atrium was mildly dilated. - Atrial septum: No defect or patent foramen ovale was identified.  EKG:  EKG is ordered today. The ekg ordered today demonstrates NSR, rate 95 bpm.   Recent Labs: 10/04/2016: BUN 15; Creatinine, Ser 1.18; Hemoglobin 12.8; Platelets 270; Potassium 3.9; Sodium 140 02/02/2017: ALT 25   Lipid Panel    Component Value Date/Time   CHOL 105 02/02/2017 0749   TRIG 129 02/02/2017 0749   HDL 34 (L) 02/02/2017 0749   CHOLHDL 3.1 02/02/2017 0749   LDLCALC 45 02/02/2017 0749     Wt Readings from Last 3 Encounters:  07/29/17 241 lb 12.8 oz (109.7 kg)  01/26/17 245 lb 1.9 oz (111.2 kg)  10/20/16 252 lb 12.8 oz (114.7 kg)     Other studies Reviewed: Additional studies/ records that were reviewed today include: . Review of the above records demonstrates:   Assessment and Plan:    1. CAD without angina: He has no chest pain suggestive of angina. He had PCI in November 2017 with drug eluting stents placed in the RCA and Diagonal. He has a CTO of the OM branch. Will continue medical therapy with (ASA/Placebo and Brilinta-He is in the TWILIGHT study protocol), beta blocker, ARB and statin.   2. HTN: BP is controlled and on the lower side per pt report at home. Will call back with dose of Cozaar that he is taking. If he is already on Cozaar 25 mg daily, will probably have him hold this and continue Coreg.   Current medicines are reviewed at length with the patient today.  The patient does not have concerns regarding medicines.  The following changes have been made:  no change  Labs/ tests ordered today include:   Orders Placed This Encounter  Procedures  . EKG 12-[place]    Disposition:   FU with me in 6 months  Signed, Lauree Chandler, MD 07/29/2017 3:45 PM    [facility] [place], [place], [place]  [address] Phone: [phone]; Fax: [phone]

## 2017-08-13 ENCOUNTER — Encounter (HOSPITAL_COMMUNITY)
Admission: RE | Admit: 2017-08-13 | Discharge: 2017-08-13 | Disposition: A | Discharge status: Home / Self Care | Payer: Self-pay | Source: Ambulatory Visit | Attending: Cardiovascular Disease | Admitting: Cardiovascular Disease

## 2017-08-20 ENCOUNTER — Encounter (HOSPITAL_COMMUNITY)
Admission: RE | Admit: 2017-08-20 | Discharge: 2017-08-20 | Disposition: A | Discharge status: Home / Self Care | Payer: Self-pay | Source: Ambulatory Visit | Attending: Cardiovascular Disease | Admitting: Cardiovascular Disease

## 2017-08-25 DIAGNOSIS — Z23 Encounter for immunization: Secondary | ICD-10-CM | POA: Diagnosis not present

## 2017-08-25 DIAGNOSIS — E538 Deficiency of other specified B group vitamins: Secondary | ICD-10-CM | POA: Diagnosis not present

## 2017-08-26 ENCOUNTER — Encounter (HOSPITAL_COMMUNITY)
Admission: RE | Admit: 2017-08-26 | Discharge: 2017-08-26 | Disposition: A | Discharge status: Home / Self Care | Payer: Self-pay | Source: Ambulatory Visit | Attending: Cardiovascular Disease | Admitting: Cardiovascular Disease

## 2017-08-26 DIAGNOSIS — Z955 Presence of coronary angioplasty implant and graft: Secondary | ICD-10-CM | POA: Insufficient documentation

## 2017-08-27 ENCOUNTER — Encounter (HOSPITAL_COMMUNITY)
Admission: RE | Admit: 2017-08-27 | Discharge: 2017-08-27 | Disposition: A | Discharge status: Home / Self Care | Payer: Self-pay | Source: Ambulatory Visit | Attending: Cardiovascular Disease | Admitting: Cardiovascular Disease

## 2017-08-28 DIAGNOSIS — G608 Other hereditary and idiopathic neuropathies: Secondary | ICD-10-CM | POA: Diagnosis not present

## 2017-08-28 DIAGNOSIS — E538 Deficiency of other specified B group vitamins: Secondary | ICD-10-CM | POA: Diagnosis not present

## 2017-08-28 DIAGNOSIS — R197 Diarrhea, unspecified: Secondary | ICD-10-CM | POA: Diagnosis not present

## 2017-08-28 DIAGNOSIS — Z0000 Encounter for general adult medical examination without abnormal findings: Secondary | ICD-10-CM | POA: Diagnosis not present

## 2017-08-28 DIAGNOSIS — E1142 Type 2 diabetes mellitus with diabetic polyneuropathy: Secondary | ICD-10-CM | POA: Diagnosis not present

## 2017-08-28 DIAGNOSIS — Z1159 Encounter for screening for other viral diseases: Secondary | ICD-10-CM | POA: Diagnosis not present

## 2017-08-28 DIAGNOSIS — I2510 Atherosclerotic heart disease of native coronary artery without angina pectoris: Secondary | ICD-10-CM | POA: Diagnosis not present

## 2017-08-28 DIAGNOSIS — E782 Mixed hyperlipidemia: Secondary | ICD-10-CM | POA: Diagnosis not present

## 2017-08-28 DIAGNOSIS — N401 Enlarged prostate with lower urinary tract symptoms: Secondary | ICD-10-CM | POA: Diagnosis not present

## 2017-09-03 ENCOUNTER — Encounter (HOSPITAL_COMMUNITY)
Admission: RE | Admit: 2017-09-03 | Discharge: 2017-09-03 | Disposition: A | Discharge status: Home / Self Care | Payer: Self-pay | Source: Ambulatory Visit | Attending: Cardiovascular Disease | Admitting: Cardiovascular Disease

## 2017-09-16 ENCOUNTER — Encounter (HOSPITAL_COMMUNITY)
Admission: RE | Admit: 2017-09-16 | Discharge: 2017-09-16 | Disposition: A | Discharge status: Home / Self Care | Payer: Self-pay | Source: Ambulatory Visit | Attending: Cardiovascular Disease | Admitting: Cardiovascular Disease

## 2017-09-29 ENCOUNTER — Encounter (INDEPENDENT_AMBULATORY_CARE_PROVIDER_SITE_OTHER): Payer: Self-pay | Admitting: Orthopedic Surgery

## 2017-09-29 ENCOUNTER — Ambulatory Visit (INDEPENDENT_AMBULATORY_CARE_PROVIDER_SITE_OTHER): Payer: BLUE CROSS/BLUE SHIELD | Admitting: Orthopedic Surgery

## 2017-09-29 ENCOUNTER — Ambulatory Visit (INDEPENDENT_AMBULATORY_CARE_PROVIDER_SITE_OTHER): Payer: BLUE CROSS/BLUE SHIELD

## 2017-09-29 DIAGNOSIS — G8929 Other chronic pain: Secondary | ICD-10-CM

## 2017-09-29 DIAGNOSIS — M1712 Unilateral primary osteoarthritis, left knee: Secondary | ICD-10-CM | POA: Diagnosis not present

## 2017-09-29 DIAGNOSIS — M25562 Pain in left knee: Secondary | ICD-10-CM

## 2017-09-29 MED ORDER — BUPIVACAINE HCL 0.25 % IJ SOLN
4.0000 mL | INTRAMUSCULAR | Status: AC | PRN
  Administered 2017-09-29: 4 mL via INTRA_ARTICULAR

## 2017-09-29 MED ORDER — LIDOCAINE HCL 1 % IJ SOLN
5.0000 mL | INTRAMUSCULAR | Status: AC | PRN
  Administered 2017-09-29: 5 mL

## 2017-09-29 MED ORDER — METHYLPREDNISOLONE ACETATE 40 MG/ML IJ SUSP
40.0000 mg | INTRAMUSCULAR | Status: AC | PRN
  Administered 2017-09-29: 40 mg via INTRA_ARTICULAR

## 2017-09-29 NOTE — Progress Notes (Signed)
Office Visit Note   Patient: Jermaine Jennings           Date of Birth: 10/03/1956           MRN: [id number] Visit Date: 09/29/2017 Requested by: Lavone Orn, MD [place] [employer] [address] [place],  [address] PCP: Lavone Orn, MD  Subjective: Chief Complaint  Patient presents with  . Left Knee - Pain    HPI: See above              ROS: See above  Assessment & Plan: Visit Diagnoses:  1. Chronic pain of left knee   2. Unilateral primary osteoarthritis, left knee     Plan: See above  Follow-Up Instructions: Return in about 3 weeks (around 10/20/2017).   Orders:  Orders Placed This Encounter  Procedures  . XR KNEE 3 VIEW LEFT   No orders of the defined types were placed in this encounter.     Procedures: No procedures performed   Clinical Data: No additional findings.  Objective: Vital Signs: There were no vitals taken for this visit.  Physical Exam: See above  Ortho Exam: See above  Specialty Comments:  No specialty comments available.  Imaging: Xr Knee 3 View Left  Result Date: 09/29/2017 AP lateral merchant left knee reviewed.  Patellofemoral arthritis is present.  There is also some medial compartment arthritis worse on the left than the right.  Mild spurring is present on the medial side.  No effusion is present.  The overall coronal alignment mild varus.  No fractures present    PMFS History: Patient Active Problem List   Diagnosis Date Noted  . CAD in native artery 10/04/2016  . S/P angioplasty with stent, 10/03/16 DES to [place] to Diag 10/04/2016  . Asymptomatic LV dysfunction EF by cath 45-50%  10/04/2016  . Diabetes type 2, controlled ([place]) 10/04/2016  . Research study patient 10/04/2016  . Unstable angina ([place])   . Vitamin B12 deficiency (non anemic) 09/29/2016  . Benign neoplasm of colon 09/29/2016  . Eosinophilic esophagitis 26/83/4196  . Erectile dysfunction 09/29/2016  . Essential hypertension 09/29/2016  . Mixed  hyperlipidemia 09/29/2016  . Obesity 09/29/2016  . Peripheral axonal neuropathy 09/29/2016  . Polyneuropathy due to type 2 diabetes mellitus ([place]) 09/29/2016  . Psoriasis 09/29/2016  . Psoriasis with arthropathy ([place]) 09/29/2016  . Pure hypertriglyceridemia 09/29/2016  . Uncomplicated asthma 22/29/7989   Past Medical History:  Diagnosis Date  . Asthma   . Asymptomatic LV dysfunction 10/04/2016  . Benign essential HTN   . CAD in native artery 10/04/2016  . Complication of anesthesia    " DIFFICULTY BREATHING ONE TIME "  . Coronary artery disease   . Diabetes type 2, controlled ([place]) 10/04/2016  . DM (diabetes mellitus), type 2 ([place])   . Erectile dysfunction   . Hypertriglyceridemia   . Psoriatic arthritis ([place])   . Research study patient 10/04/2016  . Rotator cuff disorder   . S/P angioplasty with stent, 10/03/16 DES to [place] to Diag 10/04/2016  . Vitamin B 12 deficiency     Family History  Problem Relation Age of Onset  . Heart disease Mother   . Hypertension Mother   . Breast cancer Mother   . Glaucoma Mother   . CAD Mother   . CVA Father   . Hypertension Father   . Neuropathy Father   . Other Father        carotid endarterectomy  . Hypertension Brother   .  Diabetes Brother   . Arthritis Maternal Grandfather   . Healthy Son   . Healthy Son     Past Surgical History:  Procedure Laterality Date  . APPENDECTOMY  1999  . colon polyps    . CORONARY STENT PLACEMENT  10/03/2016   Severe stenosis proximal moderate caliber co-dominant RCA now s/p successful PTCA/DES x 1 proximal RCA.   Marland Kitchen KNEE SURGERY Right 1975   ligament and spurs  . KNEE SURGERY Left 1980   ligaments and spurs  . TOTAL KNEE ARTHROPLASTY  1990's   Social History   Occupational History  . Not on file  Tobacco Use  . Smoking status: Never Smoker  . Smokeless tobacco: Never Used  Substance and Sexual Activity  . Alcohol use: Yes    Comment: rarely  . Drug use: No  . Sexual activity: Not  on file

## 2017-09-29 NOTE — Progress Notes (Signed)
Office Visit Note   Patient: Jermaine Jennings           Date of Birth: 1956-10-17           MRN: [id number] Visit Date: 09/29/2017 Requested by: Lavone Orn, MD [place] [employer] [place] [address], [place] [address] PCP: Lavone Orn, MD  Subjective: Chief Complaint  Patient presents with  . Left Knee - Pain    HPI: Jermaine Jennings is a 61 year old patient with ankylosing spondylitis.  He reports pain in the left knee for years but it's been much worse for the last 3 weeks after a hyperflexion injury.  Reports most of the pain as medial.  He has been icing the knee and taking Tylenol.  States he is unable to fully extend the knee but this is not new in terms of having a slight flexion contracture in that knee.  Reports swelling weakness and giving way.  Reports popping and locking along with waking from sleep at night.  He states that the knee swelled the night of injury.  All of his pain is medial.  He has not been taking any biologic medication for his ankylosing spondylitis and psoriasis.  He states he does get a bit of a cramp in the left leg 2-3 times a week.  He is working in heating and air.              ROS: All systems reviewed are negative as they relate to the chief complaint within the history of present illness.  Patient denies  fevers or chills.   Assessment & Plan: Visit Diagnoses:  1. Chronic pain of left knee   2. Unilateral primary osteoarthritis, left knee     Plan: Impression is flexion contracture left knee with hyperflexion injury.  Previous MRI scan is reviewed from 4 years ago which showed early arthritis and degenerative tearing of the medial meniscus.  Aspiration is performed today with serous fluid obtained.  No blood in the fluid.  Injected the knee with cortisone.  He needs to come back in 3 weeks and we will decide at that time for or against MRI scanning just to help [place] along the pathway of whether or not there is anything arthroscopically treatable in the knee or  whether not this is progression of arthritis.  I think it's most likely the latter at which time he'll have to decide when it is bad enough for him to need a knee replacement.  Hopefully the injection today will [facility] reset button on this injury to get him back to his preinjury level of function  Follow-Up Instructions: Return in about 3 weeks (around 10/20/2017).   Orders:  Orders Placed This Encounter  Procedures  . XR KNEE 3 VIEW LEFT   No orders of the defined types were placed in this encounter.     Procedures: Large Joint Inj on 09/29/2017 3:02 PM Indications: diagnostic evaluation, joint swelling and pain Details: 18 G 1.5 in needle, superolateral approach  Arthrogram: No  Medications: 5 mL lidocaine 1 %; 40 mg methylPREDNISolone acetate 40 MG/ML; 4 mL bupivacaine 0.25 % Aspirate: 45 mL yellow Outcome: tolerated well, no immediate complications Procedure, treatment alternatives, risks and benefits explained, specific risks discussed. Consent was given by the patient. Immediately prior to procedure a time out was called to verify the correct patient, procedure, equipment, support staff and site/side marked as required. Patient was prepped and draped in the usual sterile fashion.       Clinical Data:  No additional findings.  Objective: Vital Signs: There were no vitals taken for this visit.  Physical Exam:   Constitutional: Patient appears well-developed HEENT:  Head: Normocephalic Eyes:EOM are normal Neck: Normal range of motion Cardiovascular: Normal rate Pulmonary/chest: Effort normal Neurologic: Patient is alert Skin: Skin is warm Psychiatric: Patient has normal mood and affect    Ortho Exam: Orthopedic exam demonstrates flexion contracture of about 15-20 on the left knee with an effusion.  Collateral crucial [place] are stable extensor mechanism is intact pedal pulses palpable some psoriatic skin lesion noted on the lateral left calf.  Flexion is 205  easily.  There is medial greater than joint line lateral tenderness  Specialty Comments:  No specialty comments available.  Imaging: Xr Knee 3 View Left  Result Date: 09/29/2017 AP lateral merchant left knee reviewed.  Patellofemoral arthritis is present.  There is also some medial compartment arthritis worse on the left than the right.  Mild spurring is present on the medial side.  No effusion is present.  The overall coronal alignment mild varus.  No fractures present    PMFS History: Patient Active Problem List   Diagnosis Date Noted  . CAD in native artery 10/04/2016  . S/P angioplasty with stent, 10/03/16 DES to [place] to Diag 10/04/2016  . Asymptomatic LV dysfunction EF by cath 45-50%  10/04/2016  . Diabetes type 2, controlled ([place]) 10/04/2016  . Research study patient 10/04/2016  . Unstable angina ([place])   . Vitamin B12 deficiency (non anemic) 09/29/2016  . Benign neoplasm of colon 09/29/2016  . Eosinophilic esophagitis 59/56/3875  . Erectile dysfunction 09/29/2016  . Essential hypertension 09/29/2016  . Mixed hyperlipidemia 09/29/2016  . Obesity 09/29/2016  . Peripheral axonal neuropathy 09/29/2016  . Polyneuropathy due to type 2 diabetes mellitus ([place]) 09/29/2016  . Psoriasis 09/29/2016  . Psoriasis with arthropathy ([place]) 09/29/2016  . Pure hypertriglyceridemia 09/29/2016  . Uncomplicated asthma 64/33/2951   Past Medical History:  Diagnosis Date  . Asthma   . Asymptomatic LV dysfunction 10/04/2016  . Benign essential HTN   . CAD in native artery 10/04/2016  . Complication of anesthesia    " DIFFICULTY BREATHING ONE TIME "  . Coronary artery disease   . Diabetes type 2, controlled ([place]) 10/04/2016  . DM (diabetes mellitus), type 2 ([place])   . Erectile dysfunction   . Hypertriglyceridemia   . Psoriatic arthritis ([place])   . Research study patient 10/04/2016  . Rotator cuff disorder   . S/P angioplasty with stent, 10/03/16 DES to [place] to Diag 10/04/2016    . Vitamin B 12 deficiency     Family History  Problem Relation Age of Onset  . Heart disease Mother   . Hypertension Mother   . Breast cancer Mother   . Glaucoma Mother   . CAD Mother   . CVA Father   . Hypertension Father   . Neuropathy Father   . Other Father        carotid endarterectomy  . Hypertension Brother   . Diabetes Brother   . Arthritis Maternal Grandfather   . Healthy Son   . Healthy Son     Past Surgical History:  Procedure Laterality Date  . APPENDECTOMY  1999  . colon polyps    . CORONARY STENT PLACEMENT  10/03/2016   Severe stenosis proximal moderate caliber co-dominant RCA now s/p successful PTCA/DES x 1 proximal RCA.   Marland Kitchen KNEE SURGERY Right 1975   ligament and spurs  .  KNEE SURGERY Left 1980   ligaments and spurs  . TOTAL KNEE ARTHROPLASTY  1990's   Social History   Occupational History  . Not on file  Tobacco Use  . Smoking status: Never Smoker  . Smokeless tobacco: Never Used  Substance and Sexual Activity  . Alcohol use: Yes    Comment: rarely  . Drug use: No  . Sexual activity: Not on file

## 2017-10-09 ENCOUNTER — Other Ambulatory Visit: Payer: Self-pay | Admitting: *Deleted

## 2017-10-09 MED ORDER — ATORVASTATIN CALCIUM 80 MG PO TABS: ORAL_TABLET | ORAL | 8 refills | Status: DC

## 2017-10-21 ENCOUNTER — Encounter (INDEPENDENT_AMBULATORY_CARE_PROVIDER_SITE_OTHER): Payer: Self-pay | Admitting: Orthopedic Surgery

## 2017-10-21 ENCOUNTER — Ambulatory Visit (INDEPENDENT_AMBULATORY_CARE_PROVIDER_SITE_OTHER): Payer: BLUE CROSS/BLUE SHIELD | Admitting: Orthopedic Surgery

## 2017-10-21 DIAGNOSIS — M1712 Unilateral primary osteoarthritis, left knee: Secondary | ICD-10-CM | POA: Diagnosis not present

## 2017-10-21 DIAGNOSIS — Z1589 Genetic susceptibility to other disease: Secondary | ICD-10-CM | POA: Diagnosis not present

## 2017-10-21 DIAGNOSIS — M2550 Pain in unspecified joint: Secondary | ICD-10-CM | POA: Diagnosis not present

## 2017-10-21 DIAGNOSIS — Z23 Encounter for immunization: Secondary | ICD-10-CM | POA: Diagnosis not present

## 2017-10-21 DIAGNOSIS — L409 Psoriasis, unspecified: Secondary | ICD-10-CM | POA: Diagnosis not present

## 2017-10-21 DIAGNOSIS — L4059 Other psoriatic arthropathy: Secondary | ICD-10-CM | POA: Diagnosis not present

## 2017-10-23 ENCOUNTER — Telehealth (INDEPENDENT_AMBULATORY_CARE_PROVIDER_SITE_OTHER): Payer: Self-pay

## 2017-10-23 NOTE — Telephone Encounter (Signed)
IC patient in regards to BV for Synvisc one. No answer. LMVM for him advising per BV looks like insurance would cover at 100% after a $70 copay for his visit advised him to call and let us know how he wished to proceed.

## 2017-10-25 ENCOUNTER — Encounter (INDEPENDENT_AMBULATORY_CARE_PROVIDER_SITE_OTHER): Payer: Self-pay | Admitting: Orthopedic Surgery

## 2017-10-25 NOTE — Progress Notes (Signed)
Office Visit Note   Patient: Jermaine Jennings           Date of Birth: 1956/09/05           MRN: [id number] Visit Date: 10/21/2017 Requested by: Lavone Orn, MD [place] [employer] [place] [address], [place] [address] PCP: Lavone Orn, MD  Subjective: Chief Complaint  Patient presents with  . Left Knee - Follow-up    HPI: Jermaine Jennings is a T69-year-old patient with left knee pain.  He had a cortisone injection 09/29/2017.  This helped for a week and the pain recurred.  Reports pain with weightbearing.  Other joints also started to hurt him at that time.  This includes his shoulders.  He does have a history of psoriatic arthritis.  He was seen by a rheumatologist who started him on a prednisone taper.  He works as a Chief Strategy Officer.  He would like to see how he does with this steroid taper before proceeding with any more intervention on the left knee.              ROS: All systems reviewed are negative as they relate to the chief complaint within the history of present illness.  Patient denies  fevers or chills.   Assessment & Plan: Visit Diagnoses:  1. Unilateral primary osteoarthritis, left knee     Plan: Impression is left knee arthritis which is mild on radiographs but on exam with his restricted range of motion.  Unlikely that arthroscopy could help duration.  I would like to preapproved Synvisc gel injection for Jermaine Jennings in the event that his symptoms recur after the prednisone taper ends.  I will see him back for that injection.  Discussed MRI scanning to confirm that a total knee replacement would be his only option for treatment when and if that day arrives.  Again based on the stiffness of that left knee I do not think that arthroscopy debridement would be very effective unless he has a very clear-cut mechanical problem in the knee.  I will see him back in the future when his symptoms recur and he wants to consider gel injection in further imaging/intervention in the knee  Follow-Up  Instructions: Return if symptoms worsen or fail to improve.   Orders:  No orders of the defined types were placed in this encounter.  No orders of the defined types were placed in this encounter.     Procedures: No procedures performed   Clinical Data: No additional findings.  Objective: Vital Signs: There were no vitals taken for this visit.  Physical Exam:   Constitutional: Patient appears well-developed HEENT:  Head: Normocephalic Eyes:EOM are normal Neck: Normal range of motion Cardiovascular: Normal rate Pulmonary/chest: Effort normal Neurologic: Patient is alert Skin: Skin is warm Psychiatric: Patient has normal mood and affect    Ortho Exam: Orthopedic exam demonstrates left knee range of motion from 10-95.  Stents are mechanism is intact.  There is no effusion.  No focal joint line tenderness is present.  No warmth or skin lesions around the left knee.  No groin pain with internal/external rotation of the left leg.  Specialty Comments:  No specialty comments available.  Imaging: No results found.   PMFS History: Patient Active Problem List   Diagnosis Date Noted  . CAD in native artery 10/04/2016  . S/P angioplasty with stent, 10/03/16 DES to [place] to Diag 10/04/2016  . Asymptomatic LV dysfunction EF by cath 45-50%  10/04/2016  . Diabetes type 2, controlled (  [place]) 10/04/2016  . Research study patient 10/04/2016  . Unstable angina ([place])   . Vitamin B12 deficiency (non anemic) 09/29/2016  . Benign neoplasm of colon 09/29/2016  . Eosinophilic esophagitis 17/40/8144  . Erectile dysfunction 09/29/2016  . Essential hypertension 09/29/2016  . Mixed hyperlipidemia 09/29/2016  . Obesity 09/29/2016  . Peripheral axonal neuropathy 09/29/2016  . Polyneuropathy due to type 2 diabetes mellitus ([place]) 09/29/2016  . Psoriasis 09/29/2016  . Psoriasis with arthropathy ([place]) 09/29/2016  . Pure hypertriglyceridemia 09/29/2016  . Uncomplicated asthma 81/85/6314     Past Medical History:  Diagnosis Date  . Asthma   . Asymptomatic LV dysfunction 10/04/2016  . Benign essential HTN   . CAD in native artery 10/04/2016  . Complication of anesthesia    " DIFFICULTY BREATHING ONE TIME "  . Coronary artery disease   . Diabetes type 2, controlled ([place]) 10/04/2016  . DM (diabetes mellitus), type 2 ([place])   . Erectile dysfunction   . Hypertriglyceridemia   . Psoriatic arthritis ([place])   . Research study patient 10/04/2016  . Rotator cuff disorder   . S/P angioplasty with stent, 10/03/16 DES to [place] to Diag 10/04/2016  . Vitamin B 12 deficiency     Family History  Problem Relation Age of Onset  . Heart disease Mother   . Hypertension Mother   . Breast cancer Mother   . Glaucoma Mother   . CAD Mother   . CVA Father   . Hypertension Father   . Neuropathy Father   . Other Father        carotid endarterectomy  . Hypertension Brother   . Diabetes Brother   . Arthritis Maternal Grandfather   . Healthy Son   . Healthy Son     Past Surgical History:  Procedure Laterality Date  . APPENDECTOMY  1999  . CARDIAC CATHETERIZATION N/A 10/03/2016   Procedure: Left Heart Cath and Coronary Angiography;  Surgeon: Burnell Blanks, MD;  Location: [facility];  Service: Cardiovascular;  Laterality: N/A;  . CARDIAC CATHETERIZATION N/A 10/03/2016   Procedure: Coronary Stent Intervention;  Surgeon: Burnell Blanks, MD;  Location: [facility];  Service: Cardiovascular;  Laterality: N/A;  . colon polyps    . CORONARY STENT PLACEMENT  10/03/2016   Severe stenosis proximal moderate caliber co-dominant RCA now s/p successful PTCA/DES x 1 proximal RCA.   Marland Kitchen KNEE SURGERY Right 1975   ligament and spurs  . KNEE SURGERY Left 1980   ligaments and spurs  . TOTAL KNEE ARTHROPLASTY  1990's   Social History   Occupational History  . Not on file  Tobacco Use  . Smoking status: Never Smoker  . Smokeless tobacco: Never Used  Substance  and Sexual Activity  . Alcohol use: Yes    Comment: rarely  . Drug use: No  . Sexual activity: Not on file

## 2017-11-04 ENCOUNTER — Ambulatory Visit (INDEPENDENT_AMBULATORY_CARE_PROVIDER_SITE_OTHER): Payer: BLUE CROSS/BLUE SHIELD | Admitting: Orthopedic Surgery

## 2017-11-04 ENCOUNTER — Encounter (INDEPENDENT_AMBULATORY_CARE_PROVIDER_SITE_OTHER): Payer: Self-pay | Admitting: Orthopedic Surgery

## 2017-11-04 DIAGNOSIS — M1712 Unilateral primary osteoarthritis, left knee: Secondary | ICD-10-CM | POA: Diagnosis not present

## 2017-11-04 MED ORDER — LIDOCAINE HCL 1 % IJ SOLN
5.0000 mL | INTRAMUSCULAR | Status: AC | PRN
  Administered 2017-11-04: 5 mL

## 2017-11-04 MED ORDER — HYLAN G-F 20 48 MG/6ML IX SOSY
48.0000 mg | PREFILLED_SYRINGE | INTRA_ARTICULAR | Status: AC | PRN
  Administered 2017-11-04: 48 mg via INTRA_ARTICULAR

## 2017-11-04 NOTE — Progress Notes (Signed)
   Procedure Note  Patient: Jermaine Jennings             Date of Birth: 1956/08/01           MRN: [id number]             Visit Date: 11/04/2017  Procedures: Visit Diagnoses: Unilateral primary osteoarthritis, left knee  Large Joint Inj: L knee on 11/04/2017 2:32 PM Indications: diagnostic evaluation, joint swelling and pain Details: 18 G 1.5 in needle, superolateral approach  Arthrogram: No  Medications: 5 mL lidocaine 1 %; 48 mg Hylan 48 MG/6ML Outcome: tolerated well, no immediate complications Procedure, treatment alternatives, risks and benefits explained, specific risks discussed. Consent was given by the patient. Immediately prior to procedure a time out was called to verify the correct patient, procedure, equipment, support staff and site/side marked as required. Patient was prepped and draped in the usual sterile fashion.

## 2017-11-19 ENCOUNTER — Ambulatory Visit
Admission: RE | Admit: 2017-11-19 | Discharge: 2017-11-19 | Disposition: A | Discharge status: Home / Self Care | Payer: BLUE CROSS/BLUE SHIELD | Source: Ambulatory Visit | Attending: Nurse Practitioner | Admitting: Nurse Practitioner

## 2017-11-19 ENCOUNTER — Encounter (HOSPITAL_COMMUNITY): Payer: Self-pay | Admitting: *Deleted

## 2017-11-19 ENCOUNTER — Other Ambulatory Visit: Payer: Self-pay | Admitting: Nurse Practitioner

## 2017-11-19 ENCOUNTER — Other Ambulatory Visit: Payer: Self-pay

## 2017-11-19 ENCOUNTER — Inpatient Hospital Stay (HOSPITAL_COMMUNITY)
Admission: EM | Admit: 2017-11-19 | Discharge: 2017-11-24 | DRG: 638 | Disposition: A | Discharge status: Home / Self Care | Payer: BLUE CROSS/BLUE SHIELD | Attending: Internal Medicine | Admitting: Internal Medicine

## 2017-11-19 DIAGNOSIS — Z803 Family history of malignant neoplasm of breast: Secondary | ICD-10-CM

## 2017-11-19 DIAGNOSIS — L02611 Cutaneous abscess of right foot: Secondary | ICD-10-CM | POA: Diagnosis present

## 2017-11-19 DIAGNOSIS — E781 Pure hyperglyceridemia: Secondary | ICD-10-CM | POA: Diagnosis present

## 2017-11-19 DIAGNOSIS — E785 Hyperlipidemia, unspecified: Secondary | ICD-10-CM | POA: Diagnosis not present

## 2017-11-19 DIAGNOSIS — IMO0002 Reserved for concepts with insufficient information to code with codable children: Secondary | ICD-10-CM

## 2017-11-19 DIAGNOSIS — E1140 Type 2 diabetes mellitus with diabetic neuropathy, unspecified: Secondary | ICD-10-CM | POA: Diagnosis not present

## 2017-11-19 DIAGNOSIS — E1169 Type 2 diabetes mellitus with other specified complication: Secondary | ICD-10-CM | POA: Diagnosis present

## 2017-11-19 DIAGNOSIS — E119 Type 2 diabetes mellitus without complications: Secondary | ICD-10-CM

## 2017-11-19 DIAGNOSIS — Z86010 Personal history of colonic polyps: Secondary | ICD-10-CM | POA: Diagnosis not present

## 2017-11-19 DIAGNOSIS — E1165 Type 2 diabetes mellitus with hyperglycemia: Secondary | ICD-10-CM | POA: Diagnosis present

## 2017-11-19 DIAGNOSIS — I10 Essential (primary) hypertension: Secondary | ICD-10-CM | POA: Diagnosis not present

## 2017-11-19 DIAGNOSIS — Z880 Allergy status to penicillin: Secondary | ICD-10-CM

## 2017-11-19 DIAGNOSIS — Z955 Presence of coronary angioplasty implant and graft: Secondary | ICD-10-CM

## 2017-11-19 DIAGNOSIS — Z96659 Presence of unspecified artificial knee joint: Secondary | ICD-10-CM | POA: Diagnosis present

## 2017-11-19 DIAGNOSIS — Z8249 Family history of ischemic heart disease and other diseases of the circulatory system: Secondary | ICD-10-CM | POA: Diagnosis not present

## 2017-11-19 DIAGNOSIS — E11621 Type 2 diabetes mellitus with foot ulcer: Secondary | ICD-10-CM | POA: Diagnosis not present

## 2017-11-19 DIAGNOSIS — E11628 Type 2 diabetes mellitus with other skin complications: Principal | ICD-10-CM | POA: Diagnosis present

## 2017-11-19 DIAGNOSIS — S99921A Unspecified injury of right foot, initial encounter: Secondary | ICD-10-CM | POA: Diagnosis not present

## 2017-11-19 DIAGNOSIS — L089 Local infection of the skin and subcutaneous tissue, unspecified: Secondary | ICD-10-CM

## 2017-11-19 DIAGNOSIS — N529 Male erectile dysfunction, unspecified: Secondary | ICD-10-CM | POA: Diagnosis present

## 2017-11-19 DIAGNOSIS — L4050 Arthropathic psoriasis, unspecified: Secondary | ICD-10-CM | POA: Diagnosis present

## 2017-11-19 DIAGNOSIS — E782 Mixed hyperlipidemia: Secondary | ICD-10-CM | POA: Diagnosis present

## 2017-11-19 DIAGNOSIS — Z79891 Long term (current) use of opiate analgesic: Secondary | ICD-10-CM

## 2017-11-19 DIAGNOSIS — L02415 Cutaneous abscess of right lower limb: Secondary | ICD-10-CM | POA: Diagnosis not present

## 2017-11-19 DIAGNOSIS — L97519 Non-pressure chronic ulcer of other part of right foot with unspecified severity: Secondary | ICD-10-CM | POA: Diagnosis present

## 2017-11-19 DIAGNOSIS — E1142 Type 2 diabetes mellitus with diabetic polyneuropathy: Secondary | ICD-10-CM | POA: Diagnosis present

## 2017-11-19 DIAGNOSIS — R0602 Shortness of breath: Secondary | ICD-10-CM | POA: Diagnosis not present

## 2017-11-19 DIAGNOSIS — J45909 Unspecified asthma, uncomplicated: Secondary | ICD-10-CM | POA: Diagnosis present

## 2017-11-19 DIAGNOSIS — M79671 Pain in right foot: Secondary | ICD-10-CM | POA: Diagnosis not present

## 2017-11-19 DIAGNOSIS — Z833 Family history of diabetes mellitus: Secondary | ICD-10-CM

## 2017-11-19 DIAGNOSIS — I2510 Atherosclerotic heart disease of native coronary artery without angina pectoris: Secondary | ICD-10-CM | POA: Diagnosis present

## 2017-11-19 DIAGNOSIS — M79604 Pain in right leg: Secondary | ICD-10-CM | POA: Diagnosis not present

## 2017-11-19 DIAGNOSIS — Z79899 Other long term (current) drug therapy: Secondary | ICD-10-CM

## 2017-11-19 DIAGNOSIS — Z794 Long term (current) use of insulin: Secondary | ICD-10-CM | POA: Diagnosis not present

## 2017-11-19 DIAGNOSIS — Z823 Family history of stroke: Secondary | ICD-10-CM

## 2017-11-19 DIAGNOSIS — Z8261 Family history of arthritis: Secondary | ICD-10-CM

## 2017-11-19 DIAGNOSIS — L03115 Cellulitis of right lower limb: Secondary | ICD-10-CM | POA: Diagnosis not present

## 2017-11-19 DIAGNOSIS — Z6832 Body mass index (BMI) 32.0-32.9, adult: Secondary | ICD-10-CM

## 2017-11-19 DIAGNOSIS — Z888 Allergy status to other drugs, medicaments and biological substances status: Secondary | ICD-10-CM | POA: Diagnosis not present

## 2017-11-19 DIAGNOSIS — W19XXXA Unspecified fall, initial encounter: Secondary | ICD-10-CM

## 2017-11-19 DIAGNOSIS — E538 Deficiency of other specified B group vitamins: Secondary | ICD-10-CM | POA: Diagnosis present

## 2017-11-19 DIAGNOSIS — M7989 Other specified soft tissue disorders: Secondary | ICD-10-CM | POA: Diagnosis not present

## 2017-11-19 DIAGNOSIS — E669 Obesity, unspecified: Secondary | ICD-10-CM | POA: Diagnosis present

## 2017-11-19 DIAGNOSIS — Z83511 Family history of glaucoma: Secondary | ICD-10-CM

## 2017-11-19 LAB — CBC WITH DIFFERENTIAL/PLATELET
BASOS ABS: 0 10*3/uL (ref 0.0–0.1)
Basophils Relative: 0 %
EOS ABS: 0.3 10*3/uL (ref 0.0–0.7)
EOS PCT: 2 %
HCT: 36.3 % — ABNORMAL LOW (ref 39.0–52.0)
Hemoglobin: 12.1 g/dL — ABNORMAL LOW (ref 13.0–17.0)
Lymphocytes Relative: 12 %
Lymphs Abs: 1.6 10*3/uL (ref 0.7–4.0)
MCH: 28.9 pg (ref 26.0–34.0)
MCHC: 33.3 g/dL (ref 30.0–36.0)
MCV: 86.6 fL (ref 78.0–100.0)
Monocytes Absolute: 1.3 10*3/uL — ABNORMAL HIGH (ref 0.1–1.0)
Monocytes Relative: 9 %
Neutro Abs: 10.1 10*3/uL — ABNORMAL HIGH (ref 1.7–7.7)
Neutrophils Relative %: 77 %
PLATELETS: 267 10*3/uL (ref 150–400)
RBC: 4.19 MIL/uL — AB (ref 4.22–5.81)
RDW: 13.3 % (ref 11.5–15.5)
WBC: 13.3 10*3/uL — AB (ref 4.0–10.5)

## 2017-11-19 LAB — COMPREHENSIVE METABOLIC PANEL
ALT: 20 U/L (ref 17–63)
AST: 30 U/L (ref 15–41)
Albumin: 3.8 g/dL (ref 3.5–5.0)
Alkaline Phosphatase: 47 U/L (ref 38–126)
Anion gap: 10 (ref 5–15)
BILIRUBIN TOTAL: 2 mg/dL — AB (ref 0.3–1.2)
BUN: 15 mg/dL (ref 6–20)
CALCIUM: 8.9 mg/dL (ref 8.9–10.3)
CO2: 26 mmol/L (ref 22–32)
CREATININE: 1.03 mg/dL (ref 0.61–1.24)
Chloride: 101 mmol/L (ref 101–111)
GFR calc Af Amer: >60 mL/min (ref >60)
Glucose, Bld: 150 mg/dL — ABNORMAL HIGH (ref 65–99)
POTASSIUM: 4.9 mmol/L (ref 3.5–5.1)
Sodium: 137 mmol/L (ref 135–145)
TOTAL PROTEIN: 7.6 g/dL (ref 6.5–8.1)

## 2017-11-19 LAB — I-STAT CG4 LACTIC ACID, ED: LACTIC ACID, VENOUS: 1.47 mmol/L (ref 0.5–1.9)

## 2017-11-19 LAB — HEMOGLOBIN A1C
HEMOGLOBIN A1C: 8.4 % — AB (ref 4.8–5.6)
Mean Plasma Glucose: 194.38 mg/dL

## 2017-11-19 LAB — C-REACTIVE PROTEIN: CRP: 13.4 mg/dL — AB (ref <1.0)

## 2017-11-19 LAB — SEDIMENTATION RATE: SED RATE: 63 mm/h — AB (ref 0–16)

## 2017-11-19 LAB — PREALBUMIN: PREALBUMIN: 16 mg/dL — AB (ref 18–38)

## 2017-11-19 MED ORDER — LOSARTAN POTASSIUM 50 MG PO TABS
50.0000 mg | ORAL_TABLET | Freq: Every evening | ORAL | Status: DC
  Administered 2017-11-19 – 2017-11-23 (×5): 50 mg via ORAL
  Filled 2017-11-19 (×5): qty 1

## 2017-11-19 MED ORDER — SODIUM CHLORIDE 0.9 % IV SOLN
2000.0000 mg | Freq: Once | INTRAVENOUS | Status: AC
  Administered 2017-11-19: 2000 mg via INTRAVENOUS
  Filled 2017-11-19: qty 2000

## 2017-11-19 MED ORDER — DULOXETINE HCL 30 MG PO CPEP
60.0000 mg | ORAL_CAPSULE | Freq: Every day | ORAL | Status: DC
  Administered 2017-11-19 – 2017-11-23 (×5): 60 mg via ORAL
  Filled 2017-11-19 (×5): qty 2

## 2017-11-19 MED ORDER — ONDANSETRON HCL 4 MG PO TABS: 4.0000 mg | ORAL_TABLET | Freq: Four times a day (QID) | ORAL | Status: DC | PRN

## 2017-11-19 MED ORDER — ACETAMINOPHEN 650 MG RE SUPP: 650.0000 mg | Freq: Four times a day (QID) | RECTAL | Status: DC | PRN

## 2017-11-19 MED ORDER — INSULIN GLARGINE 100 UNIT/ML ~~LOC~~ SOLN
30.0000 [IU] | Freq: Two times a day (BID) | SUBCUTANEOUS | Status: DC
  Administered 2017-11-19: 30 [IU] via SUBCUTANEOUS
  Filled 2017-11-19 (×2): qty 0.3

## 2017-11-19 MED ORDER — PREGABALIN 75 MG PO CAPS
150.0000 mg | ORAL_CAPSULE | Freq: Two times a day (BID) | ORAL | Status: DC
  Administered 2017-11-19 – 2017-11-24 (×10): 150 mg via ORAL
  Filled 2017-11-19: qty 2
  Filled 2017-11-19 (×2): qty 3
  Filled 2017-11-19 (×7): qty 2

## 2017-11-19 MED ORDER — TRAMADOL HCL 50 MG PO TABS
50.0000 mg | ORAL_TABLET | Freq: Four times a day (QID) | ORAL | Status: DC | PRN
  Administered 2017-11-19: 50 mg via ORAL
  Filled 2017-11-19: qty 1

## 2017-11-19 MED ORDER — DEXTROSE 5 % IV SOLN
1.0000 g | Freq: Three times a day (TID) | INTRAVENOUS | Status: DC
  Administered 2017-11-19 – 2017-11-20 (×2): 1 g via INTRAVENOUS
  Filled 2017-11-19 (×3): qty 1

## 2017-11-19 MED ORDER — ONDANSETRON HCL 4 MG/2ML IJ SOLN: 4.0000 mg | Freq: Four times a day (QID) | INTRAMUSCULAR | Status: DC | PRN

## 2017-11-19 MED ORDER — TAMSULOSIN HCL 0.4 MG PO CAPS
0.4000 mg | ORAL_CAPSULE | Freq: Every day | ORAL | Status: DC
  Administered 2017-11-20 – 2017-11-24 (×5): 0.4 mg via ORAL
  Filled 2017-11-19 (×5): qty 1

## 2017-11-19 MED ORDER — INSULIN ASPART 100 UNIT/ML ~~LOC~~ SOLN
0.0000 [IU] | Freq: Three times a day (TID) | SUBCUTANEOUS | Status: DC
  Administered 2017-11-20: 3 [IU] via SUBCUTANEOUS
  Administered 2017-11-20: 2 [IU] via SUBCUTANEOUS
  Administered 2017-11-21: 3 [IU] via SUBCUTANEOUS
  Administered 2017-11-21: 2 [IU] via SUBCUTANEOUS
  Administered 2017-11-22: 3 [IU] via SUBCUTANEOUS
  Administered 2017-11-23: 2 [IU] via SUBCUTANEOUS
  Administered 2017-11-23 (×2): 3 [IU] via SUBCUTANEOUS
  Administered 2017-11-24: 2 [IU] via SUBCUTANEOUS
  Administered 2017-11-24: 3 [IU] via SUBCUTANEOUS

## 2017-11-19 MED ORDER — ATORVASTATIN CALCIUM 40 MG PO TABS
80.0000 mg | ORAL_TABLET | Freq: Every day | ORAL | Status: DC
  Administered 2017-11-20 – 2017-11-23 (×4): 80 mg via ORAL
  Filled 2017-11-19 (×4): qty 2

## 2017-11-19 MED ORDER — CLINDAMYCIN PHOSPHATE 600 MG/50ML IV SOLN
600.0000 mg | Freq: Once | INTRAVENOUS | Status: AC
  Administered 2017-11-19: 600 mg via INTRAVENOUS
  Filled 2017-11-19: qty 50

## 2017-11-19 MED ORDER — CIPROFLOXACIN IN D5W 400 MG/200ML IV SOLN
400.0000 mg | Freq: Once | INTRAVENOUS | Status: AC
Start: 2017-11-19 — End: 2017-11-19
  Administered 2017-11-19: 400 mg via INTRAVENOUS
  Filled 2017-11-19: qty 200

## 2017-11-19 MED ORDER — CARVEDILOL 6.25 MG PO TABS
6.2500 mg | ORAL_TABLET | Freq: Every day | ORAL | Status: DC
  Administered 2017-11-19 – 2017-11-24 (×6): 6.25 mg via ORAL
  Filled 2017-11-19 (×6): qty 1

## 2017-11-19 MED ORDER — HALOBETASOL PROPIONATE 0.05 % EX CREA: 1.0000 "application " | TOPICAL_CREAM | Freq: Two times a day (BID) | CUTANEOUS | Status: DC | PRN

## 2017-11-19 MED ORDER — ENOXAPARIN SODIUM 60 MG/0.6ML ~~LOC~~ SOLN
55.0000 mg | Freq: Every day | SUBCUTANEOUS | Status: DC
  Administered 2017-11-19 – 2017-11-23 (×5): 55 mg via SUBCUTANEOUS
  Filled 2017-11-19 (×3): qty 0.6
  Filled 2017-11-19: qty 0.55
  Filled 2017-11-19: qty 0.6

## 2017-11-19 MED ORDER — VANCOMYCIN HCL 10 G IV SOLR
1250.0000 mg | Freq: Two times a day (BID) | INTRAVENOUS | Status: DC
  Administered 2017-11-20 – 2017-11-24 (×9): 1250 mg via INTRAVENOUS
  Filled 2017-11-19 (×10): qty 1250

## 2017-11-19 MED ORDER — ACETAMINOPHEN 325 MG PO TABS: 650.0000 mg | ORAL_TABLET | Freq: Four times a day (QID) | ORAL | Status: DC | PRN

## 2017-11-19 NOTE — ED Notes (Signed)
Ciprofloxacin was left clamped from previous shift. It has now been unclamped and started

## 2017-11-19 NOTE — ED Notes (Signed)
2nd set of cultures were drawn shortly after antibiotic was started due to pt being a hard stick. MD made aware.

## 2017-11-19 NOTE — Progress Notes (Signed)
Pharmacy Antibiotic Note  Jermaine Jennings is a 61 y.o. male admitted on 11/19/2017 with diabetic foot   Pharmacy has been consulted for vancomycin and aztreonam dosing.  Plan: Vancomycin 2gm IV x 1 then 1250mg  IV q12h (AUC 522.7, Scr 1.0) Aztreonam 1gm IV q8h Follow renal function and clinical course  Height: 6' 1.5" (186.7 cm) Weight: 250 lb 6 oz (113.6 kg) IBW/kg (Calculated) : 81.05  Temp (24hrs), Avg:98.6 F (37 C), Min:98.6 F (37 C), Max:98.6 F (37 C)  Recent Labs  Lab 11/19/17 1837 11/19/17 1846  WBC 13.3*  --   CREATININE 1.03  --   LATICACIDVEN  --  1.47    Estimated Creatinine Clearance: 100.2 mL/min (by C-G formula based on SCr of 1.03 mg/dL).    Allergies  Allergen Reactions  . Gabapentin Other (See Comments)    Other reaction(s): sleepy sleepy  . Indocin [Indomethacin]     Other reaction(s): h/a's  . Penicillins Rash    Has patient had a PCN reaction causing immediate rash, facial/tongue/throat swelling, SOB or lightheadedness with hypotension:yes Has patient had a PCN reaction causing severe rash involving mucus membranes or skin necrosis:No Has patient had a PCN reaction that required hospitalization:No Has patient had a PCN reaction occurring within the last 10 years:NO If all of the above answers are "NO", then may proceed with Cephalosporin use.     Antimicrobials this admission: 12/27 vanc >> 12/27 aztreonam >> Dose adjustments this admission:   Microbiology results: 12/27 BCx: sent  Thank you for allowing pharmacy to be a part of this patient's care.  Dolly Rias RPh 11/19/2017, 10:06 PM Pager [phone]

## 2017-11-19 NOTE — H&P (Signed)
History and Physical    Jermaine Jennings WKG:[id number] DOB: [birth year] DOA: 11/19/2017  PCP: Lavone Orn, MD  Patient coming from: Home  I have personally briefly reviewed patient's old medical records in [place]  Chief Complaint: Diabetic foot ulcer and infection  HPI: Jermaine Jennings is a 61 y.o. male with medical history significant of DM2, HTN, CAD with stent in Nov 2017, HLD.  Patient presents to the ED with infected R great toe, 10/10 pain.  Diabetic foot ulcer present.  No known fevers.  No PMH of MRSA.  PCP did some work up: WBC 14k, x ray R foot showed soft tissue swelling.  R leg DVT US done which was neg.   ED Course: WBC 13.3.  Given cipro (PCN allergy).   Review of Systems: As per HPI otherwise 10 point review of systems negative.   Past Medical History:  Diagnosis Date  . Asthma   . Asymptomatic LV dysfunction 10/04/2016  . Benign essential HTN   . CAD in native artery 10/04/2016  . Complication of anesthesia    " DIFFICULTY BREATHING ONE TIME "  . Coronary artery disease   . Diabetes type 2, controlled ([place]) 10/04/2016  . DM (diabetes mellitus), type 2 ([place])   . Erectile dysfunction   . Hypertriglyceridemia   . Psoriatic arthritis ([place])   . Research study patient 10/04/2016  . Rotator cuff disorder   . S/P angioplasty with stent, 10/03/16 DES to [place] to Diag 10/04/2016  . Vitamin B 12 deficiency     Past Surgical History:  Procedure Laterality Date  . APPENDECTOMY  1999  . CARDIAC CATHETERIZATION N/A 10/03/2016   Procedure: Left Heart Cath and Coronary Angiography;  Surgeon: Burnell Blanks, MD;  Location: [facility];  Service: Cardiovascular;  Laterality: N/A;  . CARDIAC CATHETERIZATION N/A 10/03/2016   Procedure: Coronary Stent Intervention;  Surgeon: Burnell Blanks, MD;  Location: [facility];  Service: Cardiovascular;  Laterality: N/A;  . colon polyps    . CORONARY STENT PLACEMENT  10/03/2016   Severe  stenosis proximal moderate caliber co-dominant RCA now s/p successful PTCA/DES x 1 proximal RCA.   Marland Kitchen KNEE SURGERY Right 1975   ligament and spurs  . KNEE SURGERY Left 1980   ligaments and spurs  . TOTAL KNEE ARTHROPLASTY  1990's     reports that  has never smoked. he has never used smokeless tobacco. He reports that he drinks alcohol. He reports that he does not use drugs.  Allergies  Allergen Reactions  . Gabapentin Other (See Comments)    Other reaction(s): sleepy sleepy  . Indocin [Indomethacin]     Other reaction(s): h/a's  . Penicillins Rash    Has patient had a PCN reaction causing immediate rash, facial/tongue/throat swelling, SOB or lightheadedness with hypotension:yes Has patient had a PCN reaction causing severe rash involving mucus membranes or skin necrosis:No Has patient had a PCN reaction that required hospitalization:No Has patient had a PCN reaction occurring within the last 10 years:NO If all of the above answers are "NO", then may proceed with Cephalosporin use.     Family History  Problem Relation Age of Onset  . Heart disease Mother   . Hypertension Mother   . Breast cancer Mother   . Glaucoma Mother   . CAD Mother   . CVA Father   . Hypertension Father   . Neuropathy Father   . Other Father  carotid endarterectomy  . Hypertension Brother   . Diabetes Brother   . Arthritis Maternal Grandfather   . Healthy Son   . Healthy Son      Prior to Admission medications   Medication Sig Start Date End Date Taking? Authorizing Provider  acetaminophen (TYLENOL) 325 MG tablet Take 2 tablets (650 mg total) by mouth every 4 (four) hours as needed for headache or mild pain. 10/04/16  Yes Isaiah Serge, NP  AMBULATORY NON FORMULARY MEDICATION Take 90 mg by mouth 2 (two) times daily. Medication Name: BRILINTA 90 mg BID (TWILIGHT Research Study PROVIDED) 10/07/16  Yes Burnell Blanks, MD  AMBULATORY NON FORMULARY MEDICATION Take 81 mg by mouth daily.  Medication Name: Aspirin 81 mg Daily or PLACEBO (TWILIGHT Research study PROVIDED) 01/06/17  Yes Burnell Blanks, MD  atorvastatin (LIPITOR) 80 MG tablet take 1 tablet by mouth once daily AT 6PM 10/09/17  Yes Burnell Blanks, MD  carvedilol (COREG) 6.25 MG tablet Take 6.25 mg by mouth daily.    Yes [provider]  Cyanocobalamin (B-12) 1000 MCG/ML KIT Inject 1,000 mcg as directed. EVERY 31 DAYS   Yes [provider]  DULoxetine (CYMBALTA) 60 MG capsule Take 60 mg by mouth daily.   Yes [provider]  halobetasol (ULTRAVATE) 0.05 % cream Apply 1 application topically 2 (two) times daily as needed (for psoriasis).    Yes [provider]  Insulin Glargine (LANTUS SOLOSTAR) 100 UNIT/ML Solostar Pen Inject 40 Units into the skin 2 (two) times daily.  08/27/15  Yes [provider]  losartan (COZAAR) 50 MG tablet Take 50 mg by mouth every evening.  08/11/16  Yes [provider]  LYRICA 150 MG capsule Take 150 mg by mouth 2 (two) times daily. 09/22/16  Yes [provider]  Menthol, Topical Analgesic, (ICY HOT BACK EX) Apply 1 application topically daily as needed (BACK PAIN).   Yes [provider]  metFORMIN (GLUCOPHAGE-XR) 500 MG 24 hr tablet Take 2 tablets (1,000 mg total) by mouth 2 (two) times daily. 10/06/16  Yes Isaiah Serge, NP  nitroGLYCERIN (NITROSTAT) 0.4 MG SL tablet Place 1 tablet (0.4 mg total) under the tongue every 5 (five) minutes as needed for chest pain. 10/04/16  Yes Isaiah Serge, NP  tamsulosin (FLOMAX) 0.4 MG CAPS capsule Take 0.4 mg by mouth every morning.   Yes [provider]  traMADol (ULTRAM) 50 MG tablet Take 50 mg by mouth every 6 (six) hours as needed (for pain.).   Yes [provider]  TRULICITY 1.5 QM/0.8QP SOPN Inject 1.5 mg as directed every Sunday.  09/21/16  Yes [provider]    Physical Exam: Vitals:   11/19/17 1311 11/19/17 1313 11/19/17 1543  11/19/17 1858  BP: 121/77 123/70 133/77 121/77  Pulse: 100 (!) 102 (!) 107 100  Resp: _0 Temp:  98.6 F (37 C)    TempSrc:  Oral    SpO2: 97% 100% 98% 97%  Weight: 113.6 kg (250 lb 6 oz)     Height: 6' 1.5" (1.867 m)       Constitutional: NAD, calm, comfortable Eyes: PERRL, lids and conjunctivae normal ENMT: Mucous membranes are moist. Posterior pharynx clear of any exudate or lesions.Normal dentition.  Neck: normal, supple, no masses, no thyromegaly Respiratory: clear to auscultation bilaterally, no wheezing, no crackles. Normal respiratory effort. No accessory muscle use.  Cardiovascular: Regular rate and rhythm, no murmurs / rubs / gallops. No extremity  edema. 2+ pedal pulses. No carotid bruits.  Abdomen: no tenderness, no masses palpated. No hepatosplenomegaly. Bowel sounds positive.  Musculoskeletal: no clubbing / cyanosis. No joint deformity upper and lower extremities. Good ROM, no contractures. Normal muscle tone.  Skin:      Neurologic: CN 2-12 grossly intact. Sensation intact, DTR normal. Strength 5/5 in all 4.  Psychiatric: Normal judgment and insight. Alert and oriented x 3. Normal mood.    Labs on Admission: I have personally reviewed following labs and imaging studies  CBC: Recent Labs  Lab 11/19/17 1837  WBC 13.3*  NEUTROABS 10.1*  HGB 12.1*  HCT 36.3*  MCV 86.6  PLT 096   Basic Metabolic Panel: Recent Labs  Lab 11/19/17 1837  NA 137  K 4.9  CL 101  CO2 26  GLUCOSE 150*  BUN 15  CREATININE 1.03  CALCIUM 8.9   GFR: Estimated Creatinine Clearance: 100.2 mL/min (by C-G formula based on SCr of 1.03 mg/dL). Liver Function Tests: Recent Labs  Lab 11/19/17 1837  AST 30  ALT 20  ALKPHOS 47  BILITOT 2.0*  PROT 7.6  ALBUMIN 3.8   No results for input(s): LIPASE, AMYLASE in the last 168 hours. No results for input(s): AMMONIA in the last 168 hours. Coagulation Profile: No results for input(s): INR, PROTIME in the last 168  hours. Cardiac Enzymes: No results for input(s): CKTOTAL, CKMB, CKMBINDEX, TROPONINI in the last 168 hours. BNP (last 3 results) No results for input(s): PROBNP in the last 8760 hours. HbA1C: No results for input(s): HGBA1C in the last 72 hours. CBG: No results for input(s): GLUCAP in the last 168 hours. Lipid Profile: No results for input(s): CHOL, HDL, LDLCALC, TRIG, CHOLHDL, LDLDIRECT in the last 72 hours. Thyroid Function Tests: No results for input(s): TSH, T4TOTAL, FREET4, T3FREE, THYROIDAB in the last 72 hours. Anemia Panel: No results for input(s): VITAMINB12, FOLATE, FERRITIN, TIBC, IRON, RETICCTPCT in the last 72 hours. Urine analysis: No results found for: COLORURINE, APPEARANCEUR, LABSPEC, [place], GLUCOSEU, HGBUR, BILIRUBINUR, KETONESUR, PROTEINUR, UROBILINOGEN, NITRITE, LEUKOCYTESUR  Radiological Exams on Admission: US Venous Img Lower Unilateral Right  Result Date: 11/19/2017 CLINICAL DATA:  Right leg pain EXAM: RIGHT LOWER EXTREMITY VENOUS DUPLEX ULTRASOUND TECHNIQUE: Doppler venous assessment of the right lower extremity deep venous system was performed, including characterization of spectral flow, compressibility, and phasicity. COMPARISON:  None. FINDINGS: There is complete compressibility of the right common femoral, femoral, and popliteal veins. Doppler analysis demonstrates respiratory phasicity and augmentation of flow with calf compression. No obvious superficial vein or calf vein thrombosis. IMPRESSION: No evidence of right lower extremity DVT. Electronically Signed   By: Marybelle Killings M.D.   On: 11/19/2017 11:19   Dg Foot Complete Right  Result Date: 11/19/2017 CLINICAL DATA:  Fall and twisted right foot 2 days ago . Pain and swelling of great toe. Initial encounter. Ankylosing spondylitis. EXAM: RIGHT FOOT COMPLETE - 3+ VIEW COMPARISON:  12/02/2010 FINDINGS: There is no evidence of fracture or dislocation. Advanced arthropathy showing severe joint space narrowing,  small cystic lucencies, and degenerative spurring is seen involving the interphalangeal joint of the great toe, which shows mild progression since previous study mild soft tissue swelling great toe also seen. Plantar calcaneal bone spur also noted. IMPRESSION: No evidence of fracture. Progressive severe arthropathy of the great toe interphalangeal joint, with associated soft tissue swelling. Electronically Signed   By: Earle Gell M.D.   On: 11/19/2017 09:22    EKG: Independently reviewed.  Assessment/Plan Principal Problem:  Diabetic infection of right foot ([place]) Active Problems:   Essential hypertension   Diabetes type 2, controlled ([place])   Diabetic ulcer of right great toe ([place])    1. Diabetic ulcer of R great toe, infected - 1. Diabetic foot wound pathway 2. Wound care consult 3. Allergic to PCN 4. Will put on aztreonam and vanc as per foot pathway protocol for PCN allergy 2. HTN - continue home BP meds 3. DM2 - 1. Will do lantus 30 BID (takes 40 BID at home) 2. Hold other home meds 3. Mod scale SSI AC 4. Diabetes coordinator consult 5. A1C pending 4. H/o CAD - 1. Stable 2. Looks like he is on a study where he takes either ASA 81 + brilinta, or just brilinta I think. 1. Put in pharm consult to continue study meds  DVT prophylaxis: Lovenox Code Status: Full Family Communication: Wife at bedside Disposition Plan: Home after admit Consults called: None Admission status: Place in [place], [place] Hospitalists Pager [phone]  If 7AM-7PM, please contact day team taking care of patient [email or web address] Password [email or web address]  11/19/2017, 9:59 PM

## 2017-11-19 NOTE — ED Provider Notes (Signed)
[facility] DEPT Provider Note   CSN: [id number] Arrival date & time: 11/19/17  1251     History   Chief Complaint Chief Complaint  Patient presents with  . Foot Pain    infection, sent by PCP    HPI Jermaine Jennings is a 61 y.o. male.  HPI  61 year old male sent in by his PCP for IV antibiotics for a right foot infection.  The patient has a history of diabetes, type II.  He has noticed over the last couple days has not been feeling well but did not have any other symptoms.  However since yesterday has had redness to his right foot.  Has streaking going up his leg past his knee.  Most of the swelling is localized to his ankle and foot.  He had 10 out of 10 pain last night but it is better today.  He has difficulty localizing the pain because of neuropathy.  He has a chronic wound to the plantar aspect of his right great toe that has been there for months or years.  He has noticed some drainage from the toe today.  He has not had any fevers although he did feel like he had a fever last night but his temperature was low.  No known prior history of MRSA.  His PCP did lab work which showed a WBC of 14 K, as well as an x-ray of the right foot and a right leg DVT ultrasound.  Past Medical History:  Diagnosis Date  . Asthma   . Asymptomatic LV dysfunction 10/04/2016  . Benign essential HTN   . CAD in native artery 10/04/2016  . Complication of anesthesia    " DIFFICULTY BREATHING ONE TIME "  . Coronary artery disease   . Diabetes type 2, controlled ([place]) 10/04/2016  . DM (diabetes mellitus), type 2 ([place])   . Erectile dysfunction   . Hypertriglyceridemia   . Psoriatic arthritis ([place])   . Research study patient 10/04/2016  . Rotator cuff disorder   . S/P angioplasty with stent, 10/03/16 DES to [place] to Diag 10/04/2016  . Vitamin B 12 deficiency     Patient Active Problem List   Diagnosis Date Noted  . CAD in native artery 10/04/2016  . S/P  angioplasty with stent, 10/03/16 DES to [place] to Diag 10/04/2016  . Asymptomatic LV dysfunction EF by cath 45-50%  10/04/2016  . Diabetes type 2, controlled ([place]) 10/04/2016  . Research study patient 10/04/2016  . Unstable angina ([place])   . Vitamin B12 deficiency (non anemic) 09/29/2016  . Benign neoplasm of colon 09/29/2016  . Eosinophilic esophagitis 44/81/8563  . Erectile dysfunction 09/29/2016  . Essential hypertension 09/29/2016  . Mixed hyperlipidemia 09/29/2016  . Obesity 09/29/2016  . Peripheral axonal neuropathy 09/29/2016  . Polyneuropathy due to type 2 diabetes mellitus ([place]) 09/29/2016  . Psoriasis 09/29/2016  . Psoriasis with arthropathy ([place]) 09/29/2016  . Pure hypertriglyceridemia 09/29/2016  . Uncomplicated asthma 14/97/0263    Past Surgical History:  Procedure Laterality Date  . APPENDECTOMY  1999  . CARDIAC CATHETERIZATION N/A 10/03/2016   Procedure: Left Heart Cath and Coronary Angiography;  Surgeon: Burnell Blanks, MD;  Location: [facility];  Service: Cardiovascular;  Laterality: N/A;  . CARDIAC CATHETERIZATION N/A 10/03/2016   Procedure: Coronary Stent Intervention;  Surgeon: Burnell Blanks, MD;  Location: [facility];  Service: Cardiovascular;  Laterality: N/A;  . colon polyps    . CORONARY  STENT PLACEMENT  10/03/2016   Severe stenosis proximal moderate caliber co-dominant RCA now s/p successful PTCA/DES x 1 proximal RCA.   Marland Kitchen KNEE SURGERY Right 1975   ligament and spurs  . KNEE SURGERY Left 1980   ligaments and spurs  . TOTAL KNEE ARTHROPLASTY  1990's       Home Medications    Prior to Admission medications   Medication Sig Start Date End Date Taking? Authorizing Provider  acetaminophen (TYLENOL) 325 MG tablet Take 2 tablets (650 mg total) by mouth every 4 (four) hours as needed for headache or mild pain. 10/04/16  Yes Isaiah Serge, NP  AMBULATORY NON FORMULARY MEDICATION Take 90 mg by mouth 2 (two) times daily.  Medication Name: BRILINTA 90 mg BID (TWILIGHT Research Study PROVIDED) 10/07/16  Yes Burnell Blanks, MD  AMBULATORY NON FORMULARY MEDICATION Take 81 mg by mouth daily. Medication Name: Aspirin 81 mg Daily or PLACEBO (TWILIGHT Research study PROVIDED) 01/06/17  Yes Burnell Blanks, MD  atorvastatin (LIPITOR) 80 MG tablet take 1 tablet by mouth once daily AT 6PM 10/09/17  Yes Burnell Blanks, MD  carvedilol (COREG) 6.25 MG tablet Take 6.25 mg by mouth daily.    Yes [provider]  Cyanocobalamin (B-12) 1000 MCG/ML KIT Inject 1,000 mcg as directed. EVERY 31 DAYS   Yes [provider]  DULoxetine (CYMBALTA) 60 MG capsule Take 60 mg by mouth daily.   Yes [provider]  halobetasol (ULTRAVATE) 0.05 % cream Apply 1 application topically 2 (two) times daily as needed (for psoriasis).    Yes [provider]  Insulin Glargine (LANTUS SOLOSTAR) 100 UNIT/ML Solostar Pen Inject 40 Units into the skin 2 (two) times daily.  08/27/15  Yes [provider]  losartan (COZAAR) 50 MG tablet Take 50 mg by mouth every evening.  08/11/16  Yes [provider]  LYRICA 150 MG capsule Take 150 mg by mouth 2 (two) times daily. 09/22/16  Yes [provider]  metFORMIN (GLUCOPHAGE-XR) 500 MG 24 hr tablet Take 2 tablets (1,000 mg total) by mouth 2 (two) times daily. 10/06/16  Yes Isaiah Serge, NP  nitroGLYCERIN (NITROSTAT) 0.4 MG SL tablet Place 1 tablet (0.4 mg total) under the tongue every 5 (five) minutes as needed for chest pain. 10/04/16  Yes Isaiah Serge, NP  tamsulosin (FLOMAX) 0.4 MG CAPS capsule Take 0.4 mg by mouth every morning.   Yes [provider]  traMADol (ULTRAM) 50 MG tablet Take 50 mg by mouth every 6 (six) hours as needed (for pain.).   Yes [provider]  Adalimumab (HUMIRA PEN) 40 MG/0.8ML PNKT  08/31/15   [provider]  albuterol (PROVENTIL HFA;VENTOLIN HFA) 108 (90 Base) MCG/ACT inhaler  Inhale 2 puffs into the lungs every 4 (four) hours as needed for wheezing or shortness of breath.    [provider]  amitriptyline (ELAVIL) 100 MG tablet  08/11/15   [provider]  amLODipine (NORVASC) 5 MG tablet  09/23/15   [provider]  LANTUS SOLOSTAR 100 UNIT/ML Solostar Pen Inject 70 Units as directed at bedtime.  09/23/16   [provider]  losartan (COZAAR) 50 MG tablet take 1 tablet by mouth once daily 09/24/15   [provider]  metFORMIN (GLUCOPHAGE-XR) 500 MG 24 hr tablet  09/23/15   [provider]  pregabalin (LYRICA) 150 MG capsule  09/09/15   [provider]  TRULICITY 1.5 NF/6.2ZH SOPN Inject 1.5 mg as directed every Sunday.  09/21/16   [provider]    Family History Family History  Problem Relation Age of Onset  . Heart disease Mother   . Hypertension Mother   . Breast cancer Mother   . Glaucoma Mother   . CAD Mother   . CVA Father   . Hypertension Father   . Neuropathy Father   . Other Father        carotid endarterectomy  . Hypertension Brother   . Diabetes Brother   . Arthritis Maternal Grandfather   . Healthy Son   . Healthy Son     Social History Social History   Tobacco Use  . Smoking status: Never Smoker  . Smokeless tobacco: Never Used  Substance Use Topics  . Alcohol use: Yes    Comment: rarely  . Drug use: No     Allergies   Gabapentin; Indocin [indomethacin]; and Penicillins   Review of Systems Review of Systems  Constitutional: Negative for fever.  Musculoskeletal: Positive for arthralgias and joint swelling.  Skin: Positive for color change and wound.  All other systems reviewed and are negative.    Physical Exam Updated Vital Signs BP 133/77 (BP Location: Left Arm)   Pulse (!) 107   Temp 98.6 F (37 C) (Oral)   Resp 16   Ht 6' 1.5" (1.867 m)   Wt 113.6 kg (250 lb 6 oz)   SpO2 98%   BMI 32.59 kg/m   Physical Exam  Constitutional: He is  oriented to person, place, and time. He appears well-developed and well-nourished. No distress.  HENT:  Head: Normocephalic and atraumatic.  Right Ear: External ear normal.  Left Ear: External ear normal.  Nose: Nose normal.  Eyes: Right eye exhibits no discharge. Left eye exhibits no discharge.  Neck: Neck supple.  Cardiovascular: Normal rate, regular rhythm and intact distal pulses.  Pulses:      Dorsalis pedis pulses are 2+ on the right side.  Pulmonary/Chest: Effort normal.  Abdominal: He exhibits no distension.  Musculoskeletal:  See picture.  Diffuse swelling of right great toe with a small wound with some drainage on the plantar aspect.  There is further erythema to the distal foot and diffuse swelling of the foot and ankle.  Normal ankle range of motion.  There is light erythematous streaking going up the medial aspect of his lower leg and thigh.  Feet:  Right Foot:  Skin Integrity: Positive for skin breakdown (over plantar great toe), erythema and warmth.  Neurological: He is alert and oriented to person, place, and time.  Skin: Skin is warm and dry. He is not diaphoretic. There is erythema.  Nursing note and vitals reviewed.        ED Treatments / Results  Labs (all labs ordered are listed, but only abnormal results are displayed) Labs Reviewed  COMPREHENSIVE METABOLIC PANEL - Abnormal; Notable for the following components:      Result Value   Glucose, Bld 150 (*)    Total Bilirubin 2.0 (*)    All other components within normal limits  CBC WITH DIFFERENTIAL/PLATELET - Abnormal; Notable for the following components:   WBC 13.3 (*)    RBC 4.19 (*)    Hemoglobin 12.1 (*)    HCT 36.3 (*)    Neutro Abs 10.1 (*)    Monocytes Absolute 1.3 (*)    All other components within normal limits  SEDIMENTATION RATE - Abnormal; Notable for the following components:   Sed Rate 63 (*)  All other components within normal limits  C-REACTIVE PROTEIN - Abnormal; Notable for the  following components:   CRP 13.4 (*)    All other components within normal limits  HEMOGLOBIN A1C - Abnormal; Notable for the following components:   Hgb A1c MFr Bld 8.4 (*)    All other components within normal limits  CULTURE, BLOOD (ROUTINE X 2)  CULTURE, BLOOD (ROUTINE X 2)  HIV ANTIBODY (ROUTINE TESTING)  PREALBUMIN  I-STAT CG4 LACTIC ACID, ED    EKG  EKG Interpretation None       Radiology US Venous Img Lower Unilateral Right  Result Date: 11/19/2017 CLINICAL DATA:  Right leg pain EXAM: RIGHT LOWER EXTREMITY VENOUS DUPLEX ULTRASOUND TECHNIQUE: Doppler venous assessment of the right lower extremity deep venous system was performed, including characterization of spectral flow, compressibility, and phasicity. COMPARISON:  None. FINDINGS: There is complete compressibility of the right common femoral, femoral, and popliteal veins. Doppler analysis demonstrates respiratory phasicity and augmentation of flow with calf compression. No obvious superficial vein or calf vein thrombosis. IMPRESSION: No evidence of right lower extremity DVT. Electronically Signed   By: Marybelle Killings M.D.   On: 11/19/2017 11:19   Dg Foot Complete Right  Result Date: 11/19/2017 CLINICAL DATA:  Fall and twisted right foot 2 days ago . Pain and swelling of great toe. Initial encounter. Ankylosing spondylitis. EXAM: RIGHT FOOT COMPLETE - 3+ VIEW COMPARISON:  12/02/2010 FINDINGS: There is no evidence of fracture or dislocation. Advanced arthropathy showing severe joint space narrowing, small cystic lucencies, and degenerative spurring is seen involving the interphalangeal joint of the great toe, which shows mild progression since previous study mild soft tissue swelling great toe also seen. Plantar calcaneal bone spur also noted. IMPRESSION: No evidence of fracture. Progressive severe arthropathy of the great toe interphalangeal joint, with associated soft tissue swelling. Electronically Signed   By: Earle Gell M.D.    On: 11/19/2017 09:22    Procedures Procedures (including critical care time)  Medications Ordered in ED Medications  ciprofloxacin (CIPRO) IVPB 400 mg (not administered)  clindamycin (CLEOCIN) IVPB 600 mg (not administered)     Initial Impression / Assessment and Plan / ED Course  I have reviewed the triage vital signs and the nursing notes.  Pertinent labs & imaging results that were available during my care of the patient were reviewed by me and considered in my medical decision making (see chart for details).     Patient had a negative DVT ultrasound as an outpatient.  White blood cell count similar to outpatient at 13,000.  Overall patient does not appear systemically ill or septic.  His lactate is normal.  However he does have streaking going up from his foot into his leg and thus I think he needs systemic IV antibiotics.  Concerned that he could have a deeper infection I would suggest MRI.  It shows possible arthritis on his first toe on the x-ray but given this is where the infection is I would be concerned that this could be osteomyelitis.  Admit to the hospitalist.  Final Clinical Impressions(s) / ED Diagnoses   Final diagnoses:  Diabetic infection of right foot [facility])    ED Discharge Orders    None       Sherwood Gambler, MD 11/20/17 [phone]

## 2017-11-19 NOTE — ED Triage Notes (Signed)
Pt sent by PCP for right foot infection. Pt states his right foot became swollen and red 2 days ago and had 10/10 pain last night.

## 2017-11-19 NOTE — Progress Notes (Signed)
Rx Brief note: lovenox  Wt=113 kg, BMI=32  Rx adjusted Lovenox to 55 mg daily in pt with BMI>30  Thanks Dorrene German 11/19/2017 10:57 PM

## 2017-11-20 ENCOUNTER — Observation Stay (HOSPITAL_COMMUNITY): Payer: BLUE CROSS/BLUE SHIELD

## 2017-11-20 DIAGNOSIS — Z803 Family history of malignant neoplasm of breast: Secondary | ICD-10-CM | POA: Diagnosis not present

## 2017-11-20 DIAGNOSIS — I2510 Atherosclerotic heart disease of native coronary artery without angina pectoris: Secondary | ICD-10-CM | POA: Diagnosis present

## 2017-11-20 DIAGNOSIS — Z955 Presence of coronary angioplasty implant and graft: Secondary | ICD-10-CM | POA: Diagnosis not present

## 2017-11-20 DIAGNOSIS — Z880 Allergy status to penicillin: Secondary | ICD-10-CM | POA: Diagnosis not present

## 2017-11-20 DIAGNOSIS — J45909 Unspecified asthma, uncomplicated: Secondary | ICD-10-CM | POA: Diagnosis present

## 2017-11-20 DIAGNOSIS — E11621 Type 2 diabetes mellitus with foot ulcer: Secondary | ICD-10-CM | POA: Diagnosis not present

## 2017-11-20 DIAGNOSIS — L03115 Cellulitis of right lower limb: Secondary | ICD-10-CM

## 2017-11-20 DIAGNOSIS — E1142 Type 2 diabetes mellitus with diabetic polyneuropathy: Secondary | ICD-10-CM | POA: Diagnosis not present

## 2017-11-20 DIAGNOSIS — Z86010 Personal history of colonic polyps: Secondary | ICD-10-CM | POA: Diagnosis not present

## 2017-11-20 DIAGNOSIS — Z8249 Family history of ischemic heart disease and other diseases of the circulatory system: Secondary | ICD-10-CM | POA: Diagnosis not present

## 2017-11-20 DIAGNOSIS — Z794 Long term (current) use of insulin: Secondary | ICD-10-CM | POA: Diagnosis not present

## 2017-11-20 DIAGNOSIS — L089 Local infection of the skin and subcutaneous tissue, unspecified: Secondary | ICD-10-CM

## 2017-11-20 DIAGNOSIS — L97519 Non-pressure chronic ulcer of other part of right foot with unspecified severity: Secondary | ICD-10-CM | POA: Diagnosis present

## 2017-11-20 DIAGNOSIS — E1165 Type 2 diabetes mellitus with hyperglycemia: Secondary | ICD-10-CM | POA: Diagnosis not present

## 2017-11-20 DIAGNOSIS — Z83511 Family history of glaucoma: Secondary | ICD-10-CM | POA: Diagnosis not present

## 2017-11-20 DIAGNOSIS — M79671 Pain in right foot: Secondary | ICD-10-CM | POA: Diagnosis not present

## 2017-11-20 DIAGNOSIS — E1169 Type 2 diabetes mellitus with other specified complication: Secondary | ICD-10-CM | POA: Diagnosis present

## 2017-11-20 DIAGNOSIS — E781 Pure hyperglyceridemia: Secondary | ICD-10-CM | POA: Diagnosis present

## 2017-11-20 DIAGNOSIS — E538 Deficiency of other specified B group vitamins: Secondary | ICD-10-CM | POA: Diagnosis present

## 2017-11-20 DIAGNOSIS — N529 Male erectile dysfunction, unspecified: Secondary | ICD-10-CM | POA: Diagnosis present

## 2017-11-20 DIAGNOSIS — I10 Essential (primary) hypertension: Secondary | ICD-10-CM | POA: Diagnosis not present

## 2017-11-20 DIAGNOSIS — IMO0002 Reserved for concepts with insufficient information to code with codable children: Secondary | ICD-10-CM

## 2017-11-20 DIAGNOSIS — E785 Hyperlipidemia, unspecified: Secondary | ICD-10-CM | POA: Diagnosis present

## 2017-11-20 DIAGNOSIS — L02415 Cutaneous abscess of right lower limb: Secondary | ICD-10-CM

## 2017-11-20 DIAGNOSIS — E11628 Type 2 diabetes mellitus with other skin complications: Secondary | ICD-10-CM

## 2017-11-20 DIAGNOSIS — L02611 Cutaneous abscess of right foot: Secondary | ICD-10-CM | POA: Diagnosis present

## 2017-11-20 DIAGNOSIS — L4050 Arthropathic psoriasis, unspecified: Secondary | ICD-10-CM | POA: Diagnosis present

## 2017-11-20 DIAGNOSIS — Z888 Allergy status to other drugs, medicaments and biological substances status: Secondary | ICD-10-CM | POA: Diagnosis not present

## 2017-11-20 DIAGNOSIS — M7989 Other specified soft tissue disorders: Secondary | ICD-10-CM | POA: Diagnosis not present

## 2017-11-20 LAB — GLUCOSE, CAPILLARY
GLUCOSE-CAPILLARY: 126 mg/dL — AB (ref 65–99)
GLUCOSE-CAPILLARY: 153 mg/dL — AB (ref 65–99)
GLUCOSE-CAPILLARY: 142 mg/dL — AB (ref 65–99)
Glucose-Capillary: 92 mg/dL (ref 65–99)
Glucose-Capillary: 141 mg/dL — ABNORMAL HIGH (ref 65–99)

## 2017-11-20 LAB — CBC
HEMATOCRIT: 30.9 % — AB (ref 39.0–52.0)
Hemoglobin: 10.2 g/dL — ABNORMAL LOW (ref 13.0–17.0)
MCH: 28.8 pg (ref 26.0–34.0)
MCHC: 33 g/dL (ref 30.0–36.0)
MCV: 87.3 fL (ref 78.0–100.0)
PLATELETS: 236 10*3/uL (ref 150–400)
RBC: 3.54 MIL/uL — ABNORMAL LOW (ref 4.22–5.81)
RDW: 13.5 % (ref 11.5–15.5)
WBC: 10.2 10*3/uL (ref 4.0–10.5)

## 2017-11-20 LAB — BASIC METABOLIC PANEL
ANION GAP: 7 (ref 5–15)
BUN: 12 mg/dL (ref 6–20)
CALCIUM: 8.1 mg/dL — AB (ref 8.9–10.3)
CO2: 28 mmol/L (ref 22–32)
Chloride: 103 mmol/L (ref 101–111)
Creatinine, Ser: 1.05 mg/dL (ref 0.61–1.24)
Glucose, Bld: 206 mg/dL — ABNORMAL HIGH (ref 65–99)
Potassium: 4 mmol/L (ref 3.5–5.1)
SODIUM: 138 mmol/L (ref 135–145)

## 2017-11-20 LAB — SEDIMENTATION RATE: SED RATE: 67 mm/h — AB (ref 0–16)

## 2017-11-20 LAB — HIV ANTIBODY (ROUTINE TESTING W REFLEX): HIV SCREEN 4TH GENERATION: NONREACTIVE

## 2017-11-20 MED ORDER — PREMIER PROTEIN SHAKE
11.0000 [oz_av] | ORAL | Status: DC
  Administered 2017-11-20 – 2017-11-24 (×5): 11 [oz_av] via ORAL
  Filled 2017-11-20 (×5): qty 325.31

## 2017-11-20 MED ORDER — GADOBENATE DIMEGLUMINE 529 MG/ML IV SOLN
20.0000 mL | Freq: Once | INTRAVENOUS | Status: AC | PRN
  Administered 2017-11-20: 20 mL via INTRAVENOUS

## 2017-11-20 MED ORDER — INSULIN GLARGINE 100 UNIT/ML ~~LOC~~ SOLN
20.0000 [IU] | Freq: Two times a day (BID) | SUBCUTANEOUS | Status: DC
  Administered 2017-11-20 – 2017-11-24 (×9): 20 [IU] via SUBCUTANEOUS
  Filled 2017-11-20 (×12): qty 0.2

## 2017-11-20 MED ORDER — TICAGRELOR 90 MG PO TABS
90.0000 mg | ORAL_TABLET | Freq: Two times a day (BID) | ORAL | Status: DC
  Administered 2017-11-20 – 2017-11-24 (×9): 90 mg via ORAL

## 2017-11-20 MED ORDER — ASPIRIN EC 81 MG PO TBEC
81.0000 mg | DELAYED_RELEASE_TABLET | Freq: Every day | ORAL | Status: DC
  Administered 2017-11-20 – 2017-11-24 (×5): 81 mg via ORAL

## 2017-11-20 MED ORDER — DEXTROSE 5 % IV SOLN
2.0000 g | Freq: Two times a day (BID) | INTRAVENOUS | Status: DC
  Administered 2017-11-20 – 2017-11-24 (×9): 2 g via INTRAVENOUS
  Filled 2017-11-20 (×10): qty 2

## 2017-11-20 NOTE — Progress Notes (Signed)
PROGRESS NOTE  Jermaine Jennings RCV:[id number] DOB: [birth year] DOA: 11/19/2017 PCP: Lavone Orn, MD  Brief History:  61 year old male with a history of diabetes mellitus, coronary artery disease, hyperlipidemia, essential hypertension presenting with 3-day history of increasing pain, edema, and erythema on his right foot.  He has noted erythema streaking up approximately up to his left knee over the same period of time.  He has subjective fevers and chills at home.  The patient notes a chronic right great toe callus which he tried to cut with an X-Acto knife approximately 3-4 months ago.  He has not seen a podiatrist in the past.  The patient went to see his primary care provider on November 19, 2017.  Because of the severity of the infection, the patient was sent to the emergency department for further evaluation.  Is not been on any recent antibiotics in the outpatient setting.  Upon presentation, he was noted to have temperature up to 99.5 F with WBC 13.3.  Assessment/Plan: Diabetic foot infection/Cellulitis RLE -CRP 13.4 -Check ESR -MRI right foot -Consult orthopedics--spoke with Dr. Eduard Roux -continue vancomycin -d/c aztreonam -start cefepime -venous duplex--neg for DVT  Diabetes mellitus type 2, uncontrolled -11/19/2017 hemoglobin A1c 8.4 -Continue reduced dose Lantus -NovoLog sliding scale -Holding metformin and Trulicity  Essential hypertension -Continue losartan and carvedilol  Hyperlipidemia -Continue statin  Diabetic polyneuropathy -Continue Lyrica        Disposition Plan:   Home in 2-3 days  Family Communication:   No Family at bedside  Consultants:  Ortho--Xu  Code Status:  FULL  DVT Prophylaxis:   Lovenox   Procedures: As Listed in Progress Note Above  Antibiotics: vanco 12/27>>> Cefepime 12/28>>> Aztreonam 12/27    Subjective: Patient denies fevers, chills, headache, chest pain, dyspnea, nausea, vomiting, diarrhea, abdominal  pain, dysuria, hematuria, hematochezia, and melena.   Objective: Vitals:   11/19/17 1543 11/19/17 1858 11/19/17 2311 11/20/17 0521  BP: 133/77 121/77 129/73 (!) 96/48  Pulse: (!) 107 100 (!) 109   Resp: 16 16 15 14   Temp:   98.9 F (37.2 C) 99.5 F (37.5 C)  TempSrc:   Oral Oral  SpO2: 98% 97% 97% 97%  Weight:      Height:        Intake/Output Summary (Last 24 hours) at 11/20/2017 1025 Last data filed at 11/20/2017 0600 Gross per 24 hour  Intake 1640 ml  Output -  Net 1640 ml   Weight change:  Exam:   General:  Pt is alert, follows commands appropriately, not in acute distress  HEENT: No icterus, No thrush, No neck mass, [place]/AT  Cardiovascular: RRR, S1/S2, no rubs, no gallops  Respiratory: CTA bilaterally, no wheezing, no crackles, no rhonchi  Abdomen: Soft/+BS, non tender, non distended, no guarding  Extremities: Right foot with erythema streaking up from the toes to the mid calf area.  Edema and erythema of the right hallux with callus on the plantar surface without any necrosis or purulent drainage.   Data Reviewed: I have personally reviewed following labs and imaging studies Basic Metabolic Panel: Recent Labs  Lab 11/19/17 1837  NA 137  K 4.9  CL 101  CO2 26  GLUCOSE 150*  BUN 15  CREATININE 1.03  CALCIUM 8.9   Liver Function Tests: Recent Labs  Lab 11/19/17 1837  AST 30  ALT 20  ALKPHOS 47  BILITOT 2.0*  PROT 7.6  ALBUMIN 3.8   No results for input(s):  LIPASE, AMYLASE in the last 168 hours. No results for input(s): AMMONIA in the last 168 hours. Coagulation Profile: No results for input(s): INR, PROTIME in the last 168 hours. CBC: Recent Labs  Lab 11/19/17 1837  WBC 13.3*  NEUTROABS 10.1*  HGB 12.1*  HCT 36.3*  MCV 86.6  PLT 267   Cardiac Enzymes: No results for input(s): CKTOTAL, CKMB, CKMBINDEX, TROPONINI in the last 168 hours. BNP: Invalid input(s): POCBNP CBG: Recent Labs  Lab 11/20/17 0734  GLUCAP 92    HbA1C: Recent Labs    11/19/17 2200  HGBA1C 8.4*   Urine analysis: No results found for: COLORURINE, APPEARANCEUR, LABSPEC, PHURINE, GLUCOSEU, HGBUR, BILIRUBINUR, KETONESUR, PROTEINUR, UROBILINOGEN, NITRITE, LEUKOCYTESUR Sepsis Labs: @LABRCNTIP (procalcitonin:4,lacticidven:4) )No results found for this or any previous visit (from the past 240 hour(s)).   Scheduled Meds: . atorvastatin  80 mg Oral q1800  . carvedilol  6.25 mg Oral Daily  . DULoxetine  60 mg Oral Daily  . enoxaparin (LOVENOX) injection  55 mg Subcutaneous QHS  . insulin aspart  0-15 Units Subcutaneous TID WC  . insulin glargine  30 Units Subcutaneous BID  . losartan  50 mg Oral QPM  . pregabalin  150 mg Oral BID  . tamsulosin  0.4 mg Oral Daily   Continuous Infusions: . vancomycin      Procedures/Studies: US Venous Img Lower Unilateral Right  Result Date: 11/19/2017 CLINICAL DATA:  Right leg pain EXAM: RIGHT LOWER EXTREMITY VENOUS DUPLEX ULTRASOUND TECHNIQUE: Doppler venous assessment of the right lower extremity deep venous system was performed, including characterization of spectral flow, compressibility, and phasicity. COMPARISON:  None. FINDINGS: There is complete compressibility of the right common femoral, femoral, and popliteal veins. Doppler analysis demonstrates respiratory phasicity and augmentation of flow with calf compression. No obvious superficial vein or calf vein thrombosis. IMPRESSION: No evidence of right lower extremity DVT. Electronically Signed   By: Marybelle Killings M.D.   On: 11/19/2017 11:19   Dg Foot Complete Right  Result Date: 11/19/2017 CLINICAL DATA:  Fall and twisted right foot 2 days ago . Pain and swelling of great toe. Initial encounter. Ankylosing spondylitis. EXAM: RIGHT FOOT COMPLETE - 3+ VIEW COMPARISON:  12/02/2010 FINDINGS: There is no evidence of fracture or dislocation. Advanced arthropathy showing severe joint space narrowing, small cystic lucencies, and degenerative  spurring is seen involving the interphalangeal joint of the great toe, which shows mild progression since previous study mild soft tissue swelling great toe also seen. Plantar calcaneal bone spur also noted. IMPRESSION: No evidence of fracture. Progressive severe arthropathy of the great toe interphalangeal joint, with associated soft tissue swelling. Electronically Signed   By: Earle Gell M.D.   On: 11/19/2017 09:22    Orson Eva, DO  [facility] Pager [phone]  If 7PM-7AM, please contact night-coverage [email or web address] Password [email or web address] 11/20/2017, 8:24 AM   LOS: 0 days

## 2017-11-20 NOTE — Progress Notes (Signed)
VASCULAR LAB PRELIMINARY  ARTERIAL  ABI completed:    RIGHT    LEFT    PRESSURE WAVEFORM  PRESSURE WAVEFORM  BRACHIAL 136 Tri BRACHIAL 133 Tri  DP 138 Tri DP 148 Tri  PT 158 Tri PT 168 Tri  GREAT TOE  NA GREAT TOE  NA    RIGHT LEFT  ABI 1.16 1.24   Right: Resting right ankle-brachial index is within normal range. No evidence of significant right lower extremity arterial disease. Unable to obtain great toe waveform and pressure due to swelling/infection/wound.   Left: Resting left ankle-brachial index is within normal range. No evidence of significant left lower extremity arterial disease.    Everrett Coombe, RVT 11/20/2017, 11:15 AM

## 2017-11-20 NOTE — Progress Notes (Signed)
Received pt from 6 floor. Pt denies pain at this time with no s/s of distress noted. Pts assessment is unchanged from morning assessment. Will continue to monitor

## 2017-11-20 NOTE — Progress Notes (Signed)
Patient reports taking 40 units of Lantus twice a day. Current order is 20 units Lantus twice a day. Nurse paged Dr. Carles Collet to verify Lantus. Per Dr. Carles Collet correct order is Lantus 20 units twice a day.

## 2017-11-20 NOTE — Consult Note (Signed)
[facility] Nurse wound consult note Reason for Consult: Right great toe and anterior foot infection Wound type:Infectious Pressure Injury POA: N/A Measurement:Entire RGT. Callous on lateral aspect is dry, area of suspected soft tissue infectious is more posterior and lateral with white/yellow fluid accumulation beneath the tissues apparent. Edema, erythema, warmth and tenderness Wound bed:See above. No open wound except for shaved callous. Drainage (amount, consistency, odor) None Periwound:See above Dressing procedure/placement/frequency: Patient reports that he understands from Dr. Carles Collet who was in this morning that he is for MRI today.  Recommend consultation with Orthopedics (Dr. Sharol Given) to determine salvageability of hallux/digit following MRI.  If you agree, please order.  There is no role for topical care at this time.  [facility] nursing team will not follow, but will remain available to this patient, the nursing and medical teams.  Please re-consult if needed. Thanks, Maudie Flakes, MSN, RN, [place], Arther Abbott  Pager# [phone]

## 2017-11-20 NOTE — Progress Notes (Signed)
Pharmacy Antibiotic Note  Jermaine Jennings is a 61 y.o. male admitted on 11/19/2017 with diabetic foot   Pharmacy has been consulted for vancomycin and aztreonam dosing. Now changing aztreonam to cefepime on 12/28  Plan: Continue Vancomycin 2gm IV x 1 then 1250mg  IV q12h (AUC 522.7, Scr 1.0) Cefepime 2g IV q12 Follow renal function and clinical course  Height: 6' 1.5" (186.7 cm) Weight: 250 lb 6 oz (113.6 kg) IBW/kg (Calculated) : 81.05  Temp (24hrs), Avg:99 F (37.2 C), Min:98.6 F (37 C), Max:99.5 F (37.5 C)  Recent Labs  Lab 11/19/17 1837 11/19/17 1846  WBC 13.3*  --   CREATININE 1.03  --   LATICACIDVEN  --  1.47    Estimated Creatinine Clearance: 100.2 mL/min (by C-G formula based on SCr of 1.03 mg/dL).    Allergies  Allergen Reactions  . Gabapentin Other (See Comments)    Other reaction(s): sleepy sleepy  . Indocin [Indomethacin]     Other reaction(s): h/a's  . Penicillins Rash    Has patient had a PCN reaction causing immediate rash, facial/tongue/throat swelling, SOB or lightheadedness with hypotension:yes Has patient had a PCN reaction causing severe rash involving mucus membranes or skin necrosis:No Has patient had a PCN reaction that required hospitalization:No Has patient had a PCN reaction occurring within the last 10 years:NO If all of the above answers are "NO", then may proceed with Cephalosporin use.     Thank you for allowing pharmacy to be a part of this patient's care.   Adrian Saran, PharmD, BCPS Pager [phone] 11/20/2017 8:28 AM

## 2017-11-20 NOTE — Progress Notes (Signed)
Initial Nutrition Assessment  DOCUMENTATION CODES:   Obesity unspecified  INTERVENTION:  - Will order Premier Protein once/day, this supplement provides 160 kcal and 30 grams of protein.  - Continue to encourage PO intakes of meals and supplement.  NUTRITION DIAGNOSIS:   Increased nutrient needs(for protein) related to wound healing(foot infection) as evidenced by estimated needs.  GOAL:   Patient will meet greater than or equal to 90% of their needs  MONITOR:   PO intake, Supplement acceptance, Weight trends, Labs, Skin  REASON FOR ASSESSMENT:   Consult Wound healing  ASSESSMENT:   61 year old male with a history of diabetes mellitus, coronary artery disease, hyperlipidemia, essential hypertension presenting with 3-day history of increasing pain, edema, and erythema on his right foot.  He has noted erythema streaking up approximately up to his left knee over the same period of time.  He has subjective fevers and chills at home.  The patient notes a chronic right great toe callus which he tried to cut with an X-Acto knife approximately 3-4 months ago.  He has not seen a podiatrist in the past.  The patient went to see his primary care provider on November 19, 2017.  Because of the severity of the infection, the patient was sent to the emergency department for further evaluation  BMI indicates obesity. Pt consumed 100% of breakfast this AM without issue. Good appetite and intakes PTA with no issues with abdominal pain, nausea, or chewing/swallowing difficulties. Information from H&P outlined above. Estimated protein need slightly increased d/t R foot infection and will order Premier Protein once/day to help meet protein goal for healing. Weight has been stable for the past 13 months.  Medications reviewed; sliding scale Novolog.  Labs reviewed; Ca: 8.1 mg/dL, CBGs: 92 and 153 mg/dL today.      NUTRITION - FOCUSED PHYSICAL EXAM:  Completed/assessed; no muscle or fat wasting  noted.   Diet Order:  Diet Carb Modified Fluid consistency: Thin; Room service appropriate? Yes  EDUCATION NEEDS:   No education needs have been identified at this time  Skin:  Skin Assessment: Skin Integrity Issues: Skin Integrity Issues:: Other (Comment) Other: R great toe and anterior foot infection  Last BM:  12/27  Height:   Ht Readings from Last 1 Encounters:  11/19/17 6' 1.5" (1.867 m)    Weight:   Wt Readings from Last 1 Encounters:  11/19/17 250 lb 6 oz (113.6 kg)    Ideal Body Weight:  85 kg  BMI:  Body mass index is 32.59 kg/m.  Estimated Nutritional Needs:   Kcal:  1705-1930 (15-17 kcal/kg)  Protein:  100-115 grams  Fluid:  >/= 1.8 L/day     Jarome Matin, MS, RD, LDN, [facility] Inpatient Clinical Dietitian Pager # [phone] After hours/weekend pager # [phone]

## 2017-11-21 DIAGNOSIS — E11621 Type 2 diabetes mellitus with foot ulcer: Secondary | ICD-10-CM

## 2017-11-21 LAB — CBC
HCT: 32.1 % — ABNORMAL LOW (ref 39.0–52.0)
Hemoglobin: 10.4 g/dL — ABNORMAL LOW (ref 13.0–17.0)
MCH: 28.4 pg (ref 26.0–34.0)
MCHC: 32.4 g/dL (ref 30.0–36.0)
MCV: 87.7 fL (ref 78.0–100.0)
PLATELETS: 269 10*3/uL (ref 150–400)
RBC: 3.66 MIL/uL — ABNORMAL LOW (ref 4.22–5.81)
RDW: 13.4 % (ref 11.5–15.5)
WBC: 9.5 10*3/uL (ref 4.0–10.5)

## 2017-11-21 LAB — BASIC METABOLIC PANEL
Anion gap: 7 (ref 5–15)
BUN: 12 mg/dL (ref 6–20)
CHLORIDE: 107 mmol/L (ref 101–111)
CO2: 25 mmol/L (ref 22–32)
CREATININE: 0.96 mg/dL (ref 0.61–1.24)
Calcium: 8.6 mg/dL — ABNORMAL LOW (ref 8.9–10.3)
GFR calc Af Amer: >60 mL/min (ref >60)
GFR calc non Af Amer: >60 mL/min (ref >60)
Glucose, Bld: 99 mg/dL (ref 65–99)
Potassium: 3.8 mmol/L (ref 3.5–5.1)
Sodium: 139 mmol/L (ref 135–145)

## 2017-11-21 LAB — CK: Total CK: 53 U/L (ref 49–397)

## 2017-11-21 LAB — GLUCOSE, CAPILLARY
GLUCOSE-CAPILLARY: 128 mg/dL — AB (ref 65–99)
Glucose-Capillary: 85 mg/dL (ref 65–99)
Glucose-Capillary: 185 mg/dL — ABNORMAL HIGH (ref 65–99)

## 2017-11-21 NOTE — Consult Note (Signed)
ORTHOPAEDIC CONSULTATION  REQUESTING PHYSICIAN: Orson Eva, MD  Chief Complaint: right great toe pain  HPI: Jermaine [place] is a 61 y.o. male who complains of right great toe pain.  This initially began on 11/17/17 when he noticed increasing pain, erythema and drainage, fevers and chills.  It progressively worsened over two days where he was then seen by his pcp on 12/27.  There was great concern for infection, so he was sent to [facility].  Prior to this, Jermaine [place] admits to having a chronic callus on the plantar aspect of this toe for several months.  He even tried to cut this off with a knife.  Of note, he is diabetic and has coronary artery disease.    Past Medical History:  Diagnosis Date  . Asthma   . Asymptomatic LV dysfunction 10/04/2016  . Benign essential HTN   . CAD in native artery 10/04/2016  . Complication of anesthesia    " DIFFICULTY BREATHING ONE TIME "  . Coronary artery disease   . Diabetes type 2, controlled ([place]) 10/04/2016  . DM (diabetes mellitus), type 2 ([place])   . Erectile dysfunction   . Hypertriglyceridemia   . Psoriatic arthritis ([place])   . Research study patient 10/04/2016  . Rotator cuff disorder   . S/P angioplasty with stent, 10/03/16 DES to [place] to Diag 10/04/2016  . Vitamin B 12 deficiency    Past Surgical History:  Procedure Laterality Date  . APPENDECTOMY  1999  . CARDIAC CATHETERIZATION N/A 10/03/2016   Procedure: Left Heart Cath and Coronary Angiography;  Surgeon: Burnell Blanks, MD;  Location: [place] [facility];  Service: Cardiovascular;  Laterality: N/A;  . CARDIAC CATHETERIZATION N/A 10/03/2016   Procedure: Coronary Stent Intervention;  Surgeon: Burnell Blanks, MD;  Location: [place] [facility];  Service: Cardiovascular;  Laterality: N/A;  . colon polyps    . CORONARY STENT PLACEMENT  10/03/2016   Severe stenosis proximal moderate caliber co-dominant RCA now s/p successful PTCA/DES x 1 proximal RCA.   [facility] [place] KNEE SURGERY Right  1975   ligament and spurs  . KNEE SURGERY Left 1980   ligaments and spurs  . TOTAL KNEE ARTHROPLASTY  1990's   Social History   Socioeconomic History  . Marital status: Married    Spouse name: None  . Number of children: None  . Years of education: None  . Highest education level: None  Social Needs  . Financial resource strain: None  . Food insecurity - worry: None  . Food insecurity - inability: None  . Transportation needs - medical: None  . Transportation needs - non-medical: None  Occupational History  . None  Tobacco Use  . Smoking status: Never Smoker  . Smokeless tobacco: Never Used  Substance and Sexual Activity  . Alcohol use: Yes    Comment: rarely  . Drug use: No  . Sexual activity: None  Other Topics Concern  . None  Social History Narrative  . None   Family History  Problem Relation Age of Onset  . Heart disease Mother   . Hypertension Mother   . Breast cancer Mother   . Glaucoma Mother   . CAD Mother   . CVA Father   . Hypertension Father   . Neuropathy Father   . Other Father        carotid endarterectomy  . Hypertension Brother   . Diabetes Brother   . Arthritis Maternal Grandfather   . Healthy Son   .  Healthy Son    Allergies  Allergen Reactions  . Gabapentin Other (See Comments)    Other reaction(s): sleepy sleepy  . Indocin [Indomethacin]     Other reaction(s): h/a's  . Penicillins Rash    Has patient had a PCN reaction causing immediate rash, facial/tongue/throat swelling, SOB or lightheadedness with hypotension:yes Has patient had a PCN reaction causing severe rash involving mucus membranes or skin necrosis:No Has patient had a PCN reaction that required hospitalization:No Has patient had a PCN reaction occurring within the last 10 years:NO If all of the above answers are "NO", then may proceed with Cephalosporin use.    Prior to Admission medications   Medication Sig Start Date End Date Taking? Authorizing Provider   acetaminophen (TYLENOL) 325 MG tablet Take 2 tablets (650 mg total) by mouth every 4 (four) hours as needed for headache or mild pain. 10/04/16  Yes Isaiah Serge, NP  AMBULATORY NON FORMULARY MEDICATION Take 90 mg by mouth 2 (two) times daily. Medication Name: BRILINTA 90 mg BID (TWILIGHT Research Study PROVIDED) 10/07/16  Yes Burnell Blanks, MD  AMBULATORY NON FORMULARY MEDICATION Take 81 mg by mouth daily. Medication Name: Aspirin 81 mg Daily or PLACEBO (TWILIGHT Research study PROVIDED) 01/06/17  Yes Burnell Blanks, MD  atorvastatin (LIPITOR) 80 MG tablet take 1 tablet by mouth once daily AT 6PM 10/09/17  Yes Burnell Blanks, MD  carvedilol (COREG) 6.25 MG tablet Take 6.25 mg by mouth daily.    Yes [provider]  Cyanocobalamin (B-12) 1000 MCG/ML KIT Inject 1,000 mcg as directed. EVERY 31 DAYS   Yes [provider]  DULoxetine (CYMBALTA) 60 MG capsule Take 60 mg by mouth daily.   Yes [provider]  halobetasol (ULTRAVATE) 0.05 % cream Apply 1 application topically 2 (two) times daily as needed (for psoriasis).    Yes [provider]  Insulin Glargine (LANTUS SOLOSTAR) 100 UNIT/ML Solostar Pen Inject 40 Units into the skin 2 (two) times daily.  08/27/15  Yes [provider]  losartan (COZAAR) 50 MG tablet Take 50 mg by mouth every evening.  08/11/16  Yes [provider]  LYRICA 150 MG capsule Take 150 mg by mouth 2 (two) times daily. 09/22/16  Yes [provider]  Menthol, Topical Analgesic, (ICY HOT BACK EX) Apply 1 application topically daily as needed (BACK PAIN).   Yes [provider]  metFORMIN (GLUCOPHAGE-XR) 500 MG 24 hr tablet Take 2 tablets (1,000 mg total) by mouth 2 (two) times daily. 10/06/16  Yes Isaiah Serge, NP  nitroGLYCERIN (NITROSTAT) 0.4 MG SL tablet Place 1 tablet (0.4 mg total) under the tongue every 5 (five) minutes as needed for chest pain. 10/04/16  Yes Isaiah Serge, NP   tamsulosin (FLOMAX) 0.4 MG CAPS capsule Take 0.4 mg by mouth every morning.   Yes [provider]  traMADol (ULTRAM) 50 MG tablet Take 50 mg by mouth every 6 (six) hours as needed (for pain.).   Yes [provider]  TRULICITY 1.5 VQ/2.5ZD SOPN Inject 1.5 mg as directed every Sunday.  09/21/16  Yes [provider]   Mr Foot Right W Wo Contrast  Result Date: 11/20/2017 CLINICAL DATA:  Diabetic with right forefoot pain, erythema and swelling for several days. Osteomyelitis suspected. EXAM: MRI OF THE RIGHT FOREFOOT WITHOUT AND WITH CONTRAST TECHNIQUE: Multiplanar, multisequence MR imaging of the right forefoot was performed before and after the administration of intravenous contrast. CONTRAST:  19m MULTIHANCE GADOBENATE DIMEGLUMINE 529 MG/ML  IV SOLN COMPARISON:  Radiographs 11/19/2017 and 12/02/2010 FINDINGS: Bones/Joint/Cartilage In correlation with prior radiographs, there is chronic arthropathy involving the interphalangeal joint of the great toe. There is joint space narrowing with mild subchondral cyst formation. No cortical destruction or suspicious marrow enhancement identified in the great toe. There is some T2 hyperintensity and enhancement within the distal third phalanx. There is no clear corresponding radiographic finding or surrounding soft tissue abnormality in this area. The additional toes and metatarsals appear unremarkable. No significant joint effusions. Ligaments The collateral ligaments at the metatarsal phalangeal joints appear normal. The Lisfranc ligament is intact. Muscles and Tendons Intact flexor and extensor tendons. No significant tenosynovitis. Low-level T2 hyperintensity and enhancement throughout the forefoot musculature, likely diabetic myopathy. Soft tissues Moderate dorsal forefoot subcutaneous edema without suspicious enhancement. There is more extensive edema and heterogeneous enhancement throughout the soft tissues of the great toe, especially  along its plantar surface where there are small ill-defined fluid collections. Possible mild skin ulceration along the plantar and lateral aspect of the distal phalanx. IMPRESSION: 1. Findings are consistent with soft tissue infection of the great toe with heterogeneous enhancement and probable small subcutaneous abscesses. 2. No specific evidence of underlying osteomyelitis within the great toe. There are pre-existing chronic arthropathic changes at the interphalangeal joint which have been demonstrated on prior radiographs dating back to 2012. No acute bone destruction or significant joint effusion. 3. Nonspecific T2 hyperintensity and enhancement within the third distal phalanx. This could be posttraumatic. No surrounding soft tissue abnormalities in this area to suggest infection. Electronically Signed   By: Richardean Sale M.D.   On: 11/20/2017 14:47   US Venous Img Lower Unilateral Right  Result Date: 11/19/2017 CLINICAL DATA:  Right leg pain EXAM: RIGHT LOWER EXTREMITY VENOUS DUPLEX ULTRASOUND TECHNIQUE: Doppler venous assessment of the right lower extremity deep venous system was performed, including characterization of spectral flow, compressibility, and phasicity. COMPARISON:  None. FINDINGS: There is complete compressibility of the right common femoral, femoral, and popliteal veins. Doppler analysis demonstrates respiratory phasicity and augmentation of flow with calf compression. No obvious superficial vein or calf vein thrombosis. IMPRESSION: No evidence of right lower extremity DVT. Electronically Signed   By: Marybelle Killings M.D.   On: 11/19/2017 11:19   Dg Foot Complete Right  Result Date: 11/19/2017 CLINICAL DATA:  Fall and twisted right foot 2 days ago . Pain and swelling of great toe. Initial encounter. Ankylosing spondylitis. EXAM: RIGHT FOOT COMPLETE - 3+ VIEW COMPARISON:  12/02/2010 FINDINGS: There is no evidence of fracture or dislocation. Advanced arthropathy showing severe joint space  narrowing, small cystic lucencies, and degenerative spurring is seen involving the interphalangeal joint of the great toe, which shows mild progression since previous study mild soft tissue swelling great toe also seen. Plantar calcaneal bone spur also noted. IMPRESSION: No evidence of fracture. Progressive severe arthropathy of the great toe interphalangeal joint, with associated soft tissue swelling. Electronically Signed   By: Earle Gell M.D.   On: 11/19/2017 09:22    Positive ROS: All other systems have been reviewed and were otherwise negative with the exception of those mentioned in the HPI and as above.  Labs cbc Recent Labs    11/20/17 0909 11/21/17 0607  WBC 10.2 9.5  HGB 10.2* 10.4*  HCT 30.9* 32.1*  PLT 236 269    Labs inflam Recent Labs    11/19/17 1837  CRP 13.4*    Labs coag No results for input(s): INR, PTT in the last 72  hours.  Invalid input(s): PT  Recent Labs    11/20/17 0909 11/21/17 0607  NA 138 139  K 4.0 3.8  CL 103 107  CO2 28 25  GLUCOSE 206* 99  BUN 12 12  CREATININE 1.05 0.96  CALCIUM 8.1* 8.6*    Physical Exam: Vitals:   11/20/17 2136 11/21/17 0642  BP: 118/73 (!) 141/76  Pulse: 94 89  Resp: 17 17  Temp: 98.6 F (37 C) 98 F (36.7 C)  SpO2: 98% 98%   General: Alert, no acute distress Cardiovascular: No pedal edema Respiratory: No cyanosis, no use of accessory musculature GI: No organomegaly, abdomen is soft and non-tender Skin: No lesions in the area of chief complaint Neurologic: Sensation intact distally Psychiatric: Patient is competent for consent with normal mood and affect Lymphatic: No axillary or cervical lymphadenopathy  MUSCULOSKELETAL:  Right great toe with extreme erythem and edema.  There is a callus on the plantar surface, but no active drainage. Other extremities are atraumatic with painless ROM and NVI.  Assessment: Right great toe soft tissue infection without evidence of osteomyelitis  Plan: MRI  reviewed by Dr. Eduard Roux, which shows soft tissue infection with questionable small abscesses, but without osteomyelitis.   We recommend IV antibiotics and to watch for clinical improvement Weight Bearing Status: WBAT RLE VTE px: SCD's    Aundra Dubin, PA-C Cell [phone]   11/21/2017 8:59 AM

## 2017-11-21 NOTE — Progress Notes (Signed)
PROGRESS NOTE  Jermaine [place] IFO:[id number] DOB: 11/09/1956 DOA: 11/19/2017 PCP: Lavone Orn, MD  Brief History:  61 year old male with a history of diabetes mellitus, coronary artery disease, hyperlipidemia, essential hypertension presenting with 3-day history of increasing pain, edema, and erythema on his right foot.  He has noted erythema streaking up approximately up to his left knee over the same period of time.  He has subjective fevers and chills at home.  The patient notes a chronic right great toe callus which he tried to cut with an X-Acto knife approximately 3-4 months ago.  He has not seen a podiatrist in the past.  The patient went to see his primary care provider on November 19, 2017.  Because of the severity of the infection, the patient was sent to the emergency department for further evaluation.  Is not been on any recent antibiotics in the outpatient setting.  Upon presentation, he was noted to have temperature up to 99.5 F with WBC 13.3.  Assessment/Plan: Diabetic foot infection/Cellulitis RLE -CRP 13.4 -Check ESR--67 -MRI right foot--no osteomyelitis.  Possible small subcutaneous abscesses in the right great toe area -Consult orthopedics--Dr. Legrand Como Xu-->continue IV abx and monitor clinically -continue vancomycin -d/c aztreonam -start cefepime -venous duplex--neg for DVT  Diabetes mellitus type 2, uncontrolled -11/19/2017 hemoglobin A1c 8.4 -Continue reduced dose Lantus -NovoLog sliding scale -Holding metformin and Trulicity  Essential hypertension -Continue losartan and carvedilol  Hyperlipidemia -Continue statin  Diabetic polyneuropathy -Continue Lyrica    Disposition Plan:   Home in 1-2 days  Family Communication:   No Family at bedside  Consultants:  Ortho--Xu  Code Status:  FULL  DVT Prophylaxis:  [place] Lovenox   Procedures: As Listed in Progress Note Above  Antibiotics: vanco 12/27>>> Cefepime 12/28>>> Aztreonam  12/27       Subjective: Patient denies fevers, chills, headache, chest pain, dyspnea, nausea, vomiting, diarrhea, abdominal pain, dysuria, hematuria, hematochezia, and melena.  He states that the erythema in his calf and pretibial area have improved, but his right great toe swelling has not improved much.   Objective: Vitals:   11/20/17 1339 11/20/17 2136 11/21/17 0642 11/21/17 1316  BP: 113/66 118/73 (!) 141/76 133/73  Pulse: 93 94 89 90  Resp: _0 Temp: 98.5 F (36.9 C) 98.6 F (37 C) 98 F (36.7 C) 98.3 F (36.8 C)  TempSrc: Oral Oral Oral Oral  SpO2: 97% 98% 98% 99%  Weight:      Height:        Intake/Output Summary (Last 24 hours) at 11/21/2017 1639 Last data filed at 11/21/2017 1325 Gross per 24 hour  Intake 660 ml  Output -  Net 660 ml   Weight change:  Exam:   General:  Pt is alert, follows commands appropriately, not in acute distress  HEENT: No icterus, No thrush, No neck mass, [place]/AT  Cardiovascular: RRR, S1/S2, no rubs, no gallops  Respiratory: CTA bilaterally, no wheezing, no crackles, no rhonchi  Abdomen: Soft/+BS, non tender, non distended, no guarding  Extremities: see pictures below of RLE         Data Reviewed: I have personally reviewed following labs and imaging studies Basic Metabolic Panel: Recent Labs  Lab 11/19/17 1837 11/20/17 0909 11/21/17 0607  NA 137 138 139  K 4.9 4.0 3.8  CL 101 103 107  CO2 _1 GLUCOSE 150* 206* 99  BUN _2 CREATININE 1.03 1.05 0.96  CALCIUM 8.9 8.1* 8.6*   Liver Function Tests: Recent Labs  Lab 11/19/17 1837  AST 30  ALT 20  ALKPHOS 47  BILITOT 2.0*  PROT 7.6  ALBUMIN 3.8   No results for input(s): LIPASE, AMYLASE in the last 168 hours. No results for input(s): AMMONIA in the last 168 hours. Coagulation Profile: No results for input(s): INR, PROTIME in the last 168 hours. CBC: Recent Labs  Lab 11/19/17 1837 11/20/17 0909 11/21/17 0607  WBC 13.3* 10.2  9.5  NEUTROABS 10.1*  --   --   HGB 12.1* 10.2* 10.4*  HCT 36.3* 30.9* 32.1*  MCV 86.6 87.3 87.7  PLT 267 236 269   Cardiac Enzymes: Recent Labs  Lab 11/21/17 0607  CKTOTAL 53   BNP: Invalid input(s): POCBNP CBG: Recent Labs  Lab 11/20/17 1115 11/20/17 1610 11/20/17 2135 11/21/17 0731 11/21/17 1138  GLUCAP 153* 142* 141* 85 185*   HbA1C: Recent Labs    11/19/17 2200  HGBA1C 8.4*   Urine analysis: No results found for: COLORURINE, [place], LABSPEC, PHURINE, GLUCOSEU, HGBUR, BILIRUBINUR, KETONESUR, PROTEINUR, UROBILINOGEN, NITRITE, LEUKOCYTESUR Sepsis Labs: _0 (procalcitonin:4,lacticidven:4) ) Recent Results (from the past 240 hour(s))  Culture, blood (routine x 2)     Status: None (Preliminary result)   Collection Time: 11/19/17  6:37 PM  Result Value Ref Range Status   Specimen Description BLOOD LEFT HAND  Final   Special Requests   Final    BOTTLES DRAWN AEROBIC AND ANAEROBIC Blood Culture adequate volume   Culture   Final    NO GROWTH 2 DAYS Performed at [facility], [place] [address] [place], [place] [address]    Report Status PENDING  Incomplete  Culture, blood (routine x 2)     Status: None (Preliminary result)   Collection Time: 11/19/17  7:13 PM  Result Value Ref Range Status   Specimen Description BLOOD RIGHT HAND  Final   Special Requests IN PEDIATRIC BOTTLE Blood Culture adequate volume  Final   Culture   Final    NO GROWTH 2 DAYS Performed at [facility], [place] [address], [place], [place] [address]    Report Status PENDING  Incomplete     Scheduled Meds: . ticagrelor  90 mg Oral BID   And  . aspirin EC  81 mg Oral Daily  . atorvastatin  80 mg Oral q1800  . carvedilol  6.25 mg Oral Daily  . DULoxetine  60 mg Oral Daily  . enoxaparin (LOVENOX) injection  55 mg Subcutaneous QHS  . insulin aspart  0-15 Units Subcutaneous TID WC  . insulin glargine  20 Units Subcutaneous BID  . losartan  50 mg Oral QPM  . pregabalin   150 mg Oral BID  . protein supplement shake  11 oz Oral Q24H  . tamsulosin  0.4 mg Oral Daily   Continuous Infusions: . ceFEPime (MAXIPIME) IV Stopped (11/21/17 1325)  . vancomycin Stopped (11/21/17 1228)    Procedures/Studies: Mr Foot Right W Wo Contrast  Result Date: 11/20/2017 CLINICAL DATA:  Diabetic with right forefoot pain, erythema and swelling for several days. Osteomyelitis suspected. EXAM: MRI OF THE RIGHT FOREFOOT WITHOUT AND WITH CONTRAST TECHNIQUE: Multiplanar, multisequence MR imaging of the right forefoot was performed before and after the administration of intravenous contrast. CONTRAST:  4m MULTIHANCE GADOBENATE DIMEGLUMINE 529 MG/ML IV SOLN COMPARISON:  Radiographs 11/19/2017 and 12/02/2010 FINDINGS: Bones/Joint/Cartilage In correlation with prior radiographs, there is chronic arthropathy involving the interphalangeal joint of the great toe. There is  joint space narrowing with mild subchondral cyst formation. No cortical destruction or suspicious marrow enhancement identified in the great toe. There is some T2 hyperintensity and enhancement within the distal third phalanx. There is no clear corresponding radiographic finding or surrounding soft tissue abnormality in this area. The additional toes and metatarsals appear unremarkable. No significant joint effusions. Ligaments The collateral ligaments at the metatarsal phalangeal joints appear normal. The Lisfranc ligament is intact. Muscles and Tendons Intact flexor and extensor tendons. No significant tenosynovitis. Low-level T2 hyperintensity and enhancement throughout the forefoot musculature, likely diabetic myopathy. Soft tissues Moderate dorsal forefoot subcutaneous edema without suspicious enhancement. There is more extensive edema and heterogeneous enhancement throughout the soft tissues of the great toe, especially along its plantar surface where there are small ill-defined fluid collections. Possible mild skin ulceration  along the plantar and lateral aspect of the distal phalanx. IMPRESSION: 1. Findings are consistent with soft tissue infection of the great toe with heterogeneous enhancement and probable small subcutaneous abscesses. 2. No specific evidence of underlying osteomyelitis within the great toe. There are pre-existing chronic arthropathic changes at the interphalangeal joint which have been demonstrated on prior radiographs dating back to 2012. No acute bone destruction or significant joint effusion. 3. Nonspecific T2 hyperintensity and enhancement within the third distal phalanx. This could be posttraumatic. No surrounding soft tissue abnormalities in this area to suggest infection. Electronically Signed   By: Richardean Sale M.D.   On: 11/20/2017 14:47   US Venous Img Lower Unilateral Right  Result Date: 11/19/2017 CLINICAL DATA:  Right leg pain EXAM: RIGHT LOWER EXTREMITY VENOUS DUPLEX ULTRASOUND TECHNIQUE: Doppler venous assessment of the right lower extremity deep venous system was performed, including characterization of spectral flow, compressibility, and phasicity. COMPARISON:  None. FINDINGS: There is complete compressibility of the right common femoral, femoral, and popliteal veins. Doppler analysis demonstrates respiratory phasicity and augmentation of flow with calf compression. No obvious superficial vein or calf vein thrombosis. IMPRESSION: No evidence of right lower extremity DVT. Electronically Signed   By: Marybelle Killings M.D.   On: 11/19/2017 11:19   Dg Foot Complete Right  Result Date: 11/19/2017 CLINICAL DATA:  Fall and twisted right foot 2 days ago . Pain and swelling of great toe. Initial encounter. Ankylosing spondylitis. EXAM: RIGHT FOOT COMPLETE - 3+ VIEW COMPARISON:  12/02/2010 FINDINGS: There is no evidence of fracture or dislocation. Advanced arthropathy showing severe joint space narrowing, small cystic lucencies, and degenerative spurring is seen involving the interphalangeal joint of  the great toe, which shows mild progression since previous study mild soft tissue swelling great toe also seen. Plantar calcaneal bone spur also noted. IMPRESSION: No evidence of fracture. Progressive severe arthropathy of the great toe interphalangeal joint, with associated soft tissue swelling. Electronically Signed   By: Earle Gell M.D.   On: 11/19/2017 09:22    Orson Eva, DO  [facility] Pager [phone]  If 7PM-7AM, please contact night-coverage [email or web address] Password [email or web address] 11/21/2017, 4:39 PM   LOS: 1 day

## 2017-11-22 LAB — CBC
HEMATOCRIT: 30.8 % — AB (ref 39.0–52.0)
HEMOGLOBIN: 10 g/dL — AB (ref 13.0–17.0)
MCH: 28.3 pg (ref 26.0–34.0)
MCHC: 32.5 g/dL (ref 30.0–36.0)
MCV: 87.3 fL (ref 78.0–100.0)
PLATELETS: 284 10*3/uL (ref 150–400)
RBC: 3.53 MIL/uL — AB (ref 4.22–5.81)
RDW: 13.2 % (ref 11.5–15.5)
WBC: 9.4 10*3/uL (ref 4.0–10.5)

## 2017-11-22 LAB — GLUCOSE, CAPILLARY
GLUCOSE-CAPILLARY: 189 mg/dL — AB (ref 65–99)
GLUCOSE-CAPILLARY: 160 mg/dL — AB (ref 65–99)
Glucose-Capillary: 106 mg/dL — ABNORMAL HIGH (ref 65–99)
Glucose-Capillary: 115 mg/dL — ABNORMAL HIGH (ref 65–99)
Glucose-Capillary: 164 mg/dL — ABNORMAL HIGH (ref 65–99)

## 2017-11-22 NOTE — Discharge Summary (Signed)
PROGRESS NOTE  Jermaine Jennings [id number]:[id number] DOB: 01/02/1956 DOA: 11/19/2017 PCP: Lavone Orn, MD Brief History: 61 year old male with a history of diabetes mellitus, coronary artery disease, hyperlipidemia, essential hypertension presenting with 3-day history of increasing pain, edema, and erythema on his right foot. He has noted erythema streaking up approximately up to his left knee over the same period of time. He has subjective fevers and chills at home. The patient notes a chronic right great toe callus which he tried to cut with an X-Acto knife approximately 3-4 months ago. He has not seen a podiatrist in the past. The patient went to see his primary care provider on November 19, 2017. Because of the severity of the infection, the patient was sent to the emergency department for further evaluation. Is not been on any recent antibiotics in the outpatient setting. Upon presentation, he was noted to have temperature up to 99.5 F with WBC 13.3.  Assessment/Plan: Diabetic foot infection/Cellulitis RLE -CRP 13.4 -Check ESR--67 -MRI right foot--no osteomyelitis.  Possible small subcutaneous abscesses in the right great toe area -Consult orthopedics--Dr. Legrand Como Xu-->continue IV abx and monitor clinically--no immediate surgery at this time -continue vancomycin and cefepime -venous duplex--neg for DVT  -repeat CRP in am -see pictures of foot (12/30) below  Diabetes mellitus type 2, uncontrolled -11/19/2017 hemoglobin A1c 8.4 -Continue reduced dose Lantus -NovoLog sliding scale -Holding metformin and Trulicity -CBGs controlled  Essential hypertension -Continue losartan and carvedilol  Hyperlipidemia -Continue statin  Diabetic polyneuropathy -Continue Lyrica    Disposition Plan: Home in 1-2days  Family Communication:Spouse update at bedside 12/30  Consultants:Ortho--Xu  Code Status: FULL  DVT Prophylaxis: [place]  Lovenox   Procedures: As Listed in Progress Note Above  Antibiotics: vanco 12/27>>> Cefepime 12/28>>> Aztreonam 12/27       Subjective: Patient denies fevers, chills, headache, chest pain, dyspnea, nausea, vomiting, diarrhea, abdominal pain, dysuria, hematuria, hematochezia, and melena.  Patient states that the redness and pain and swelling seem to be slowly improving on his right foot.  He is able to bear weight with no pain.   Objective: Vitals:   11/21/17 1316 11/21/17 2140 11/22/17 0531 11/22/17 1406  BP: 133/73 (!) 141/77 136/88 134/75  Pulse: 90 89 98 88  Resp: 16 15 16 16   Temp: 98.3 F (36.8 C) 98.1 F (36.7 C) 98.3 F (36.8 C) 98.2 F (36.8 C)  TempSrc: Oral Oral Tympanic Oral  SpO2: 99% 99% 97% 100%  Weight:      Height:        Intake/Output Summary (Last 24 hours) at 11/22/2017 1604 Last data filed at 11/22/2017 1215 Gross per 24 hour  Intake 540 ml  Output -  Net 540 ml   Weight change:  Exam:   General:  Pt is alert, follows commands appropriately, not in acute distress  HEENT: No icterus, No thrush, No neck mass, [place]/AT  Cardiovascular: RRR, S1/S2, no rubs, no gallops  Respiratory: CTA bilaterally, no wheezing, no crackles, no rhonchi  Abdomen: Soft/+BS, non tender, non distended, no guarding  Extremities: right foot on 11/22/17--see below        Data Reviewed: I have personally reviewed following labs and imaging studies Basic Metabolic Panel: Recent Labs  Lab 11/19/17 1837 11/20/17 0909 11/21/17 0607  NA 137 138 139  K 4.9 4.0 3.8  CL 101 103 107  CO2 26 28 25   GLUCOSE 150* 206* 99  BUN 15 12 12   CREATININE 1.03 1.05 0.96  CALCIUM 8.9 8.1* 8.6*   Liver Function Tests: Recent Labs  Lab 11/19/17 1837  AST 30  ALT 20  ALKPHOS 47  BILITOT 2.0*  PROT 7.6  ALBUMIN 3.8   No results for input(s): LIPASE, AMYLASE in the last 168 hours. No results for input(s): AMMONIA in the last 168 hours. Coagulation  Profile: No results for input(s): INR, PROTIME in the last 168 hours. CBC: Recent Labs  Lab 11/19/17 1837 11/20/17 0909 11/21/17 0607 11/22/17 0613  WBC 13.3* 10.2 9.5 9.4  NEUTROABS 10.1*  --   --   --   HGB 12.1* 10.2* 10.4* 10.0*  HCT 36.3* 30.9* 32.1* 30.8*  MCV 86.6 87.3 87.7 87.3  PLT 267 236 269 284   Cardiac Enzymes: Recent Labs  Lab 11/21/17 0607  CKTOTAL 53   BNP: Invalid input(s): POCBNP CBG: Recent Labs  Lab 11/21/17 1138 11/21/17 1658 11/21/17 2138 11/22/17 0751 11/22/17 1126  GLUCAP 185* 128* 189* 106* 115*   HbA1C: Recent Labs    11/19/17 2200  HGBA1C 8.4*   Urine analysis: No results found for: COLORURINE, APPEARANCEUR, LABSPEC, PHURINE, GLUCOSEU, HGBUR, BILIRUBINUR, KETONESUR, PROTEINUR, UROBILINOGEN, NITRITE, LEUKOCYTESUR Sepsis Labs: @LABRCNTIP (procalcitonin:4,lacticidven:4) ) Recent Results (from the past 240 hour(s))  Culture, blood (routine x 2)     Status: None (Preliminary result)   Collection Time: 11/19/17  6:37 PM  Result Value Ref Range Status   Specimen Description BLOOD LEFT HAND  Final   Special Requests   Final    BOTTLES DRAWN AEROBIC AND ANAEROBIC Blood Culture adequate volume   Culture   Final    NO GROWTH 3 DAYS Performed at [facility], [place] [address], [place], [place] [address]    Report Status PENDING  Incomplete  Culture, blood (routine x 2)     Status: None (Preliminary result)   Collection Time: 11/19/17  7:13 PM  Result Value Ref Range Status   Specimen Description BLOOD RIGHT HAND  Final   Special Requests IN PEDIATRIC BOTTLE Blood Culture adequate volume  Final   Culture   Final    NO GROWTH 3 DAYS Performed at [facility], [place] [address] [place] [address]    Report Status PENDING  Incomplete     Scheduled Meds: . ticagrelor  90 mg Oral BID   And  . aspirin EC  81 mg Oral Daily  . atorvastatin  80 mg Oral q1800  . carvedilol  6.25 mg Oral Daily  . DULoxetine  60 mg Oral  Daily  . enoxaparin (LOVENOX) injection  55 mg Subcutaneous QHS  . insulin aspart  0-15 Units Subcutaneous TID WC  . insulin glargine  20 Units Subcutaneous BID  . losartan  50 mg Oral QPM  . pregabalin  150 mg Oral BID  . protein supplement shake  11 oz Oral Q24H  . tamsulosin  0.4 mg Oral Daily   Continuous Infusions: . ceFEPime (MAXIPIME) IV Stopped (11/22/17 1215)  . vancomycin Stopped (11/22/17 1133)    Procedures/Studies: Mr Foot Right W Wo Contrast  Result Date: 11/20/2017 CLINICAL DATA:  Diabetic with right forefoot pain, erythema and swelling for several days. Osteomyelitis suspected. EXAM: MRI OF THE RIGHT FOREFOOT WITHOUT AND WITH CONTRAST TECHNIQUE: Multiplanar, multisequence MR imaging of the right forefoot was performed before and after the administration of intravenous contrast. CONTRAST:  54m MULTIHANCE GADOBENATE DIMEGLUMINE 529 MG/ML IV SOLN COMPARISON:  Radiographs 11/19/2017 and 12/02/2010 FINDINGS: Bones/Joint/Cartilage In correlation with prior radiographs, there is chronic arthropathy  involving the interphalangeal joint of the great toe. There is joint space narrowing with mild subchondral cyst formation. No cortical destruction or suspicious marrow enhancement identified in the great toe. There is some T2 hyperintensity and enhancement within the distal third phalanx. There is no clear corresponding radiographic finding or surrounding soft tissue abnormality in this area. The additional toes and metatarsals appear unremarkable. No significant joint effusions. Ligaments The collateral ligaments at the metatarsal phalangeal joints appear normal. The Lisfranc ligament is intact. Muscles and Tendons Intact flexor and extensor tendons. No significant tenosynovitis. Low-level T2 hyperintensity and enhancement throughout the forefoot musculature, likely diabetic myopathy. Soft tissues Moderate dorsal forefoot subcutaneous edema without suspicious enhancement. There is more  extensive edema and heterogeneous enhancement throughout the soft tissues of the great toe, especially along its plantar surface where there are small ill-defined fluid collections. Possible mild skin ulceration along the plantar and lateral aspect of the distal phalanx. IMPRESSION: 1. Findings are consistent with soft tissue infection of the great toe with heterogeneous enhancement and probable small subcutaneous abscesses. 2. No specific evidence of underlying osteomyelitis within the great toe. There are pre-existing chronic arthropathic changes at the interphalangeal joint which have been demonstrated on prior radiographs dating back to 2012. No acute bone destruction or significant joint effusion. 3. Nonspecific T2 hyperintensity and enhancement within the third distal phalanx. This could be posttraumatic. No surrounding soft tissue abnormalities in this area to suggest infection. Electronically Signed   By: Richardean Sale M.D.   On: 11/20/2017 14:47   US Venous Img Lower Unilateral Right  Result Date: 11/19/2017 CLINICAL DATA:  Right leg pain EXAM: RIGHT LOWER EXTREMITY VENOUS DUPLEX ULTRASOUND TECHNIQUE: Doppler venous assessment of the right lower extremity deep venous system was performed, including characterization of spectral flow, compressibility, and phasicity. COMPARISON:  None. FINDINGS: There is complete compressibility of the right common femoral, femoral, and popliteal veins. Doppler analysis demonstrates respiratory phasicity and augmentation of flow with calf compression. No obvious superficial vein or calf vein thrombosis. IMPRESSION: No evidence of right lower extremity DVT. Electronically Signed   By: Marybelle Killings M.D.   On: 11/19/2017 11:19   Dg Foot Complete Right  Result Date: 11/19/2017 CLINICAL DATA:  Fall and twisted right foot 2 days ago . Pain and swelling of great toe. Initial encounter. Ankylosing spondylitis. EXAM: RIGHT FOOT COMPLETE - 3+ VIEW COMPARISON:  12/02/2010  FINDINGS: There is no evidence of fracture or dislocation. Advanced arthropathy showing severe joint space narrowing, small cystic lucencies, and degenerative spurring is seen involving the interphalangeal joint of the great toe, which shows mild progression since previous study mild soft tissue swelling great toe also seen. Plantar calcaneal bone spur also noted. IMPRESSION: No evidence of fracture. Progressive severe arthropathy of the great toe interphalangeal joint, with associated soft tissue swelling. Electronically Signed   By: Earle Gell M.D.   On: 11/19/2017 09:22    Orson Eva, DO  [facility] Pager [phone]  If 7PM-7AM, please contact night-coverage [email or web address] Password [email or web address] 11/22/2017, 4:04 PM   LOS: 2 days

## 2017-11-22 NOTE — Progress Notes (Signed)
PROGRESS NOTE  Jermaine Jennings KVQ:[id number] DOB: Apr 29, 1956 DOA: 11/19/2017 PCP: Lavone Orn, MD Brief History: 61 year old male with a history of diabetes mellitus, coronary artery disease, hyperlipidemia, essential hypertension presenting with 3-day history of increasing pain, edema, and erythema on his right foot. He has noted erythema streaking up approximately up to his left knee over the same period of time. He has subjective fevers and chills at home. The patient notes a chronic right great toe callus which he tried to cut with an X-Acto knife approximately 3-4 months ago. He has not seen a podiatrist in the past. The patient went to see his primary care provider on November 19, 2017. Because of the severity of the infection, the patient was sent to the emergency department for further evaluation. Is not been on any recent antibiotics in the outpatient setting. Upon presentation, he was noted to have temperature up to 99.5 F with WBC 13.3.  Assessment/Plan: Diabetic foot infection/Cellulitis RLE -CRP 13.4 -Check ESR--67 -MRI right foot--no osteomyelitis.  Possible small subcutaneous abscesses in the right great toe area -Consult orthopedics--Dr. Legrand Como Xu-->continue IV abx and monitor clinically--no immediate surgery at this time -continue vancomycin and cefepime -venous duplex--neg for DVT  -repeat CRP in am -see pictures of foot (12/30) below  Diabetes mellitus type 2, uncontrolled -11/19/2017 hemoglobin A1c 8.4 -Continue reduced dose Lantus -NovoLog sliding scale -Holding metformin and Trulicity -CBGs controlled  Essential hypertension -Continue losartan and carvedilol  Hyperlipidemia -Continue statin  Diabetic polyneuropathy -Continue Lyrica    Disposition Plan: Home in 1-2days  Family Communication:Spouse update at bedside 12/30  Consultants:Ortho--Xu  Code Status: FULL  DVT Prophylaxis: [place]  Lovenox   Procedures: As Listed in Progress Note Above  Antibiotics: vanco 12/27>>> Cefepime 12/28>>> Aztreonam 12/27       Subjective: Patient denies fevers, chills, headache, chest pain, dyspnea, nausea, vomiting, diarrhea, abdominal pain, dysuria, hematuria, hematochezia, and melena.  Patient states that the redness and pain and swelling seem to be slowly improving on his right foot.  He is able to bear weight with no pain.   Objective: Vitals:   11/21/17 1316 11/21/17 2140 11/22/17 0531 11/22/17 1406  BP: 133/73 (!) 141/77 136/88 134/75  Pulse: 90 89 98 88  Resp: 16 15 16 16   Temp: 98.3 F (36.8 C) 98.1 F (36.7 C) 98.3 F (36.8 C) 98.2 F (36.8 C)  TempSrc: Oral Oral Tympanic Oral  SpO2: 99% 99% 97% 100%  Weight:      Height:        Intake/Output Summary (Last 24 hours) at 11/22/2017 1621 Last data filed at 11/22/2017 1215 Gross per 24 hour  Intake 540 ml  Output -  Net 540 ml   Weight change:  Exam:   General:  Pt is alert, follows commands appropriately, not in acute distress  HEENT: No icterus, No thrush, No neck mass, /AT  Cardiovascular: RRR, S1/S2, no rubs, no gallops  Respiratory: CTA bilaterally, no wheezing, no crackles, no rhonchi  Abdomen: Soft/+BS, non tender, non distended, no guarding  Extremities: right foot on 11/22/17--see below        Data Reviewed: I have personally reviewed following labs and imaging studies Basic Metabolic Panel: Recent Labs  Lab 11/19/17 1837 11/20/17 0909 11/21/17 0607  NA 137 138 139  K 4.9 4.0 3.8  CL 101 103 107  CO2 26 28 25   GLUCOSE 150* 206* 99  BUN 15 12 12   CREATININE 1.03 1.05 0.96  CALCIUM 8.9 8.1* 8.6*   Liver Function Tests: Recent Labs  Lab 11/19/17 1837  AST 30  ALT 20  ALKPHOS 47  BILITOT 2.0*  PROT 7.6  ALBUMIN 3.8   No results for input(s): LIPASE, AMYLASE in the last 168 hours. No results for input(s): AMMONIA in the last 168 hours. Coagulation  Profile: No results for input(s): INR, PROTIME in the last 168 hours. CBC: Recent Labs  Lab 11/19/17 1837 11/20/17 0909 11/21/17 0607 11/22/17 0613  WBC 13.3* 10.2 9.5 9.4  NEUTROABS 10.1*  --   --   --   HGB 12.1* 10.2* 10.4* 10.0*  HCT 36.3* 30.9* 32.1* 30.8*  MCV 86.6 87.3 87.7 87.3  PLT 267 236 269 284   Cardiac Enzymes: Recent Labs  Lab 11/21/17 0607  CKTOTAL 53   BNP: Invalid input(s): POCBNP CBG: Recent Labs  Lab 11/21/17 1138 11/21/17 1658 11/21/17 2138 11/22/17 0751 11/22/17 1126  GLUCAP 185* 128* 189* 106* 115*   HbA1C: Recent Labs    11/19/17 2200  HGBA1C 8.4*   Urine analysis: No results found for: COLORURINE, APPEARANCEUR, LABSPEC, PHURINE, GLUCOSEU, HGBUR, BILIRUBINUR, KETONESUR, PROTEINUR, UROBILINOGEN, NITRITE, LEUKOCYTESUR Sepsis Labs: @LABRCNTIP (procalcitonin:4,lacticidven:4) ) Recent Results (from the past 240 hour(s))  Culture, blood (routine x 2)     Status: None (Preliminary result)   Collection Time: 11/19/17  6:37 PM  Result Value Ref Range Status   Specimen Description BLOOD LEFT HAND  Final   Special Requests   Final    BOTTLES DRAWN AEROBIC AND ANAEROBIC Blood Culture adequate volume   Culture   Final    NO GROWTH 3 DAYS Performed at [facility], [place] [address] [place], [place] [address]    Report Status PENDING  Incomplete  Culture, blood (routine x 2)     Status: None (Preliminary result)   Collection Time: 11/19/17  7:13 PM  Result Value Ref Range Status   Specimen Description BLOOD RIGHT HAND  Final   Special Requests IN PEDIATRIC BOTTLE Blood Culture adequate volume  Final   Culture   Final    NO GROWTH 3 DAYS Performed at [facility], [place] [address] [place], [place] [address]    Report Status PENDING  Incomplete     Scheduled Meds: . ticagrelor  90 mg Oral BID   And  . aspirin EC  81 mg Oral Daily  . atorvastatin  80 mg Oral q1800  . carvedilol  6.25 mg Oral Daily  . DULoxetine  60 mg Oral  Daily  . enoxaparin (LOVENOX) injection  55 mg Subcutaneous QHS  . insulin aspart  0-15 Units Subcutaneous TID WC  . insulin glargine  20 Units Subcutaneous BID  . losartan  50 mg Oral QPM  . pregabalin  150 mg Oral BID  . protein supplement shake  11 oz Oral Q24H  . tamsulosin  0.4 mg Oral Daily   Continuous Infusions: . ceFEPime (MAXIPIME) IV Stopped (11/22/17 1215)  . vancomycin Stopped (11/22/17 1133)    Procedures/Studies: Mr Foot Right W Wo Contrast  Result Date: 11/20/2017 CLINICAL DATA:  Diabetic with right forefoot pain, erythema and swelling for several days. Osteomyelitis suspected. EXAM: MRI OF THE RIGHT FOREFOOT WITHOUT AND WITH CONTRAST TECHNIQUE: Multiplanar, multisequence MR imaging of the right forefoot was performed before and after the administration of intravenous contrast. CONTRAST:  57m MULTIHANCE GADOBENATE DIMEGLUMINE 529 MG/ML IV SOLN COMPARISON:  Radiographs 11/19/2017 and 12/02/2010 FINDINGS: Bones/Joint/Cartilage In correlation with prior radiographs, there is chronic arthropathy  involving the interphalangeal joint of the great toe. There is joint space narrowing with mild subchondral cyst formation. No cortical destruction or suspicious marrow enhancement identified in the great toe. There is some T2 hyperintensity and enhancement within the distal third phalanx. There is no clear corresponding radiographic finding or surrounding soft tissue abnormality in this area. The additional toes and metatarsals appear unremarkable. No significant joint effusions. Ligaments The collateral ligaments at the metatarsal phalangeal joints appear normal. The Lisfranc ligament is intact. Muscles and Tendons Intact flexor and extensor tendons. No significant tenosynovitis. Low-level T2 hyperintensity and enhancement throughout the forefoot musculature, likely diabetic myopathy. Soft tissues Moderate dorsal forefoot subcutaneous edema without suspicious enhancement. There is more  extensive edema and heterogeneous enhancement throughout the soft tissues of the great toe, especially along its plantar surface where there are small ill-defined fluid collections. Possible mild skin ulceration along the plantar and lateral aspect of the distal phalanx. IMPRESSION: 1. Findings are consistent with soft tissue infection of the great toe with heterogeneous enhancement and probable small subcutaneous abscesses. 2. No specific evidence of underlying osteomyelitis within the great toe. There are pre-existing chronic arthropathic changes at the interphalangeal joint which have been demonstrated on prior radiographs dating back to 2012. No acute bone destruction or significant joint effusion. 3. Nonspecific T2 hyperintensity and enhancement within the third distal phalanx. This could be posttraumatic. No surrounding soft tissue abnormalities in this area to suggest infection. Electronically Signed   By: Richardean Sale M.D.   On: 11/20/2017 14:47   US Venous Img Lower Unilateral Right  Result Date: 11/19/2017 CLINICAL DATA:  Right leg pain EXAM: RIGHT LOWER EXTREMITY VENOUS DUPLEX ULTRASOUND TECHNIQUE: Doppler venous assessment of the right lower extremity deep venous system was performed, including characterization of spectral flow, compressibility, and phasicity. COMPARISON:  None. FINDINGS: There is complete compressibility of the right common femoral, femoral, and popliteal veins. Doppler analysis demonstrates respiratory phasicity and augmentation of flow with calf compression. No obvious superficial vein or calf vein thrombosis. IMPRESSION: No evidence of right lower extremity DVT. Electronically Signed   By: Marybelle Killings M.D.   On: 11/19/2017 11:19   Dg Foot Complete Right  Result Date: 11/19/2017 CLINICAL DATA:  Fall and twisted right foot 2 days ago . Pain and swelling of great toe. Initial encounter. Ankylosing spondylitis. EXAM: RIGHT FOOT COMPLETE - 3+ VIEW COMPARISON:  12/02/2010  FINDINGS: There is no evidence of fracture or dislocation. Advanced arthropathy showing severe joint space narrowing, small cystic lucencies, and degenerative spurring is seen involving the interphalangeal joint of the great toe, which shows mild progression since previous study mild soft tissue swelling great toe also seen. Plantar calcaneal bone spur also noted. IMPRESSION: No evidence of fracture. Progressive severe arthropathy of the great toe interphalangeal joint, with associated soft tissue swelling. Electronically Signed   By: Earle Gell M.D.   On: 11/19/2017 09:22    Orson Eva, DO  [facility] Pager [phone]  If 7PM-7AM, please contact night-coverage [email or web address] Password [email or web address] 11/22/2017, 4:21 PM   LOS: 2 days

## 2017-11-23 DIAGNOSIS — L089 Local infection of the skin and subcutaneous tissue, unspecified: Secondary | ICD-10-CM

## 2017-11-23 DIAGNOSIS — E11628 Type 2 diabetes mellitus with other skin complications: Principal | ICD-10-CM

## 2017-11-23 LAB — BASIC METABOLIC PANEL
ANION GAP: 7 (ref 5–15)
BUN: 13 mg/dL (ref 6–20)
CALCIUM: 8.7 mg/dL — AB (ref 8.9–10.3)
CHLORIDE: 108 mmol/L (ref 101–111)
CO2: 25 mmol/L (ref 22–32)
CREATININE: 0.95 mg/dL (ref 0.61–1.24)
GLUCOSE: 135 mg/dL — AB (ref 65–99)
POTASSIUM: 3.8 mmol/L (ref 3.5–5.1)
Sodium: 140 mmol/L (ref 135–145)

## 2017-11-23 LAB — CBC
HCT: 30.8 % — ABNORMAL LOW (ref 39.0–52.0)
Hemoglobin: 10.1 g/dL — ABNORMAL LOW (ref 13.0–17.0)
MCH: 28.6 pg (ref 26.0–34.0)
MCHC: 32.8 g/dL (ref 30.0–36.0)
MCV: 87.3 fL (ref 78.0–100.0)
PLATELETS: 323 10*3/uL (ref 150–400)
RBC: 3.53 MIL/uL — ABNORMAL LOW (ref 4.22–5.81)
RDW: 13.4 % (ref 11.5–15.5)
WBC: 10.3 10*3/uL (ref 4.0–10.5)

## 2017-11-23 LAB — GLUCOSE, CAPILLARY
GLUCOSE-CAPILLARY: 122 mg/dL — AB (ref 65–99)
GLUCOSE-CAPILLARY: 194 mg/dL — AB (ref 65–99)
Glucose-Capillary: 194 mg/dL — ABNORMAL HIGH (ref 65–99)
Glucose-Capillary: 203 mg/dL — ABNORMAL HIGH (ref 65–99)

## 2017-11-23 LAB — C-REACTIVE PROTEIN: CRP: 4.4 mg/dL — AB (ref <1.0)

## 2017-11-23 NOTE — Progress Notes (Signed)
Pharmacy Antibiotic Note  Jermaine Jennings is a 61 y.o. male admitted on 11/19/2017 with diabetic foot   Pharmacy has been consulted for vancomycin and aztreonam dosing.  11/23/2017 Scr 0.95, CrCl ~ 26ml/min (N) WBC WNL afebrile  Plan: Continue Vancomycin 1250mg  IV q12h (AUC 522.7, Scr 1.0) Continue Aztreonam 1gm IV q8h F/u LOT  Height: 6' 1.5" (186.7 cm) Weight: 250 lb 6 oz (113.6 kg) IBW/kg (Calculated) : 81.05  Temp (24hrs), Avg:98.2 F (36.8 C), Min:98.2 F (36.8 C), Max:98.2 F (36.8 C)  Recent Labs  Lab 11/19/17 1837 11/19/17 1846 11/20/17 0909 11/21/17 0607 11/22/17 0613 11/23/17 0549  WBC 13.3*  --  10.2 9.5 9.4 10.3  CREATININE 1.03  --  1.05 0.96  --  0.95  LATICACIDVEN  --  1.47  --   --   --   --     Estimated Creatinine Clearance: 108.7 mL/min (by C-G formula based on SCr of 0.95 mg/dL).    Allergies  Allergen Reactions  . Gabapentin Other (See Comments)    Other reaction(s): sleepy sleepy  . Indocin [Indomethacin]     Other reaction(s): h/a's  . Penicillins Rash    Has patient had a PCN reaction causing immediate rash, facial/tongue/throat swelling, SOB or lightheadedness with hypotension:yes Has patient had a PCN reaction causing severe rash involving mucus membranes or skin necrosis:No Has patient had a PCN reaction that required hospitalization:No Has patient had a PCN reaction occurring within the last 10 years:NO If all of the above answers are "NO", then may proceed with Cephalosporin use.     Antimicrobials this admission: 12/27 vanc >> 12/27 aztreonam >> Dose adjustments this admission:   Microbiology results: 12/27 BCx: ngtd  Thank you for allowing pharmacy to be a part of this patient's care.  Dolly Rias RPh 11/23/2017, 12:32 PM Pager [phone]

## 2017-11-23 NOTE — Progress Notes (Signed)
Subjective:    Patient reports pain as mild.  Continuing to clinically improve daily.  No fever/chills.  Minimal pain to right great toe.  Objective: Vital signs in last 24 hours: Temp:  [98.2 F (36.8 C)] 98.2 F (36.8 C) (12/31 0554) Pulse Rate:  [73-88] 88 (12/31 0554) Resp:  [16] 16 (12/31 0554) BP: (127-139)/(74-78) 127/74 (12/31 0554) SpO2:  [100 %] 100 % (12/31 0554)  Intake/Output from previous day: 12/30 0701 - 12/31 0700 In: 540 [P.O.:240; IV Piggyback:300] Out: -  Intake/Output this shift: No intake/output data recorded.  Recent Labs    11/20/17 0909 11/21/17 0607 11/22/17 0613 11/23/17 0549  HGB 10.2* 10.4* 10.0* 10.1*   Recent Labs    11/22/17 0613 11/23/17 0549  WBC 9.4 10.3  RBC 3.53* 3.53*  HCT 30.8* 30.8*  PLT 284 323   Recent Labs    11/21/17 0607 11/23/17 0549  NA 139 140  K 3.8 3.8  CL 107 108  CO2 25 25  BUN 12 13  CREATININE 0.96 0.95  GLUCOSE 99 135*  CALCIUM 8.6* 8.7*   No results for input(s): LABPT, INR in the last 72 hours.  Neurologically intact Neurovascular intact Intact pulses distally Dorsiflexion/Plantar flexion intact Compartment soft  Right great toe still very swollen and erythematous, but does look better compared to Saturday.  No streaking proximally.  Active drainage from callous.   EHL/FHL intact Decreased sensation secondary to peripheral neuropathy   Assessment/Plan:    Continue IV cefepime and vancomycin WBAT RLE F/u with Dr. Erlinda Hong in one week  Jermaine Jennings 11/23/2017, 7:19 AM

## 2017-11-23 NOTE — Progress Notes (Addendum)
PROGRESS NOTE    Jermaine Jennings  ZOX:[id number] DOB: 03-Mar-1956 DOA: 11/19/2017 PCP: Lavone Orn, MD   Brief Narrative:  Patient is a 39 old male with past medical history of diabetes mellitus, coronary artery disease, hyperlipidemia, hypertension who presented with 3-day history of increasing pain, edema and erythema of his right foot.  Patient currently being managed for cellulitis of the right foot.  Patient was also found to have fever and white cell count elevation on presentation. Assessment & Plan:   Principal Problem:   Diabetic infection of right foot ([place]) Active Problems:   Essential hypertension   Diabetes type 2, controlled ([place])   Diabetic ulcer of right great toe ([place])   Cellulitis and abscess of right leg   Uncontrolled type 2 diabetes mellitus with hyperglycemia, with long-term current use of insulin ([place])   Uncontrolled diabetes mellitus with diabetic polyneuropathy, with long-term current use of insulin ([facility])   Diabetic foot infection ([place])  Diabetic foot infection/cellulitis right lower extremity: MRI of the right foot negative for osteomyelitis.  Orthopedics was following.  He will follow-up with orthopedics as an outpatient. Currently on IV antibiotics with vancomycin and cefepime. CRP has trended down. White cell counts are stable.  Patient has remained afebrile. DC planning in 1-2 days with oral antibiotics. Venous duplex negative for DVT.  Diabetes mellitus type 2: Uncontrolled.  Hemoglobin A1c on 11/19/17 was 8.4. Continue insulin.  Continue to monitor blood glucose.  Hypertension: Continue losartan and carvedilol  Hyperlipidemia: Continue statin  Diabetic polyneuropathy: Continue Lyrica   DVT prophylaxis:Lovenox Code Status: Full Family Communication: None present at the bedside Disposition Plan: Tomorrow with oral antibiotics   Consultants: Orthopedics  Procedures: None  Antimicrobials: Vancomycin and aztreonam since  12/28  Subjective: Patient seen and examined the bedside this afternoon.  Remains comfortable.  Right lower extremity cellulitis has been improving.  Wants to go home.  Objective: Vitals:   11/22/17 1406 11/22/17 2058 11/23/17 0554 11/23/17 1424  BP: 134/75 139/78 127/74 124/84  Pulse: 88 73 88 82  Resp: 16 16 16 16   Temp: 98.2 F (36.8 C) 98.2 F (36.8 C) 98.2 F (36.8 C) 97.8 F (36.6 C)  TempSrc: Oral Oral Oral Oral  SpO2: 100% 100% 100% 99%  Weight:      Height:        Intake/Output Summary (Last 24 hours) at 11/23/2017 1622 Last data filed at 11/23/2017 0900 Gross per 24 hour  Intake 480 ml  Output -  Net 480 ml   Filed Weights   11/19/17 1311  Weight: 113.6 kg (250 lb 6 oz)    Examination:  General exam: Appears calm and comfortable ,Not in distress,average built Respiratory system: Bilateral equal air entry, normal vesicular breath sounds, no wheezes or crackles  Cardiovascular system: S1 & S2 heard, RRR. No JVD, murmurs, rubs, gallops or clicks. No pedal edema. Gastrointestinal system: Abdomen is nondistended, soft and nontender. No organomegaly or masses felt. Normal bowel sounds heard. Central nervous system: Alert and oriented. No focal neurological deficits. Extremities: No edema, no clubbing ,no cyanosis, distal peripheral pulses palpable. Swollen, erythematous and tender right toe, callus on the medial side of the right toe, fluctuant area on the lateral side of the right toe Skin: No rashes, lesions or ulcers,no icterus ,no pallor Psychiatry: Judgement and insight appear normal. Mood & affect appropriate.     Data Reviewed: I have personally reviewed following labs and imaging studies  CBC: Recent Labs  Lab 11/19/17 1837 11/20/17 0909 11/21/17  7782 11/22/17 0613 11/23/17 0549  WBC 13.3* 10.2 9.5 9.4 10.3  NEUTROABS 10.1*  --   --   --   --   HGB 12.1* 10.2* 10.4* 10.0* 10.1*  HCT 36.3* 30.9* 32.1* 30.8* 30.8*  MCV 86.6 87.3 87.7 87.3 87.3   PLT 267 236 269 284 423   Basic Metabolic Panel: Recent Labs  Lab 11/19/17 1837 11/20/17 0909 11/21/17 0607 11/23/17 0549  NA 137 138 139 140  K 4.9 4.0 3.8 3.8  CL 101 103 107 108  CO2 26 28 25 25   GLUCOSE 150* 206* 99 135*  BUN 15 12 12 13   CREATININE 1.03 1.05 0.96 0.95  CALCIUM 8.9 8.1* 8.6* 8.7*   GFR: Estimated Creatinine Clearance: 108.7 mL/min (by C-G formula based on SCr of 0.95 mg/dL). Liver Function Tests: Recent Labs  Lab 11/19/17 1837  AST 30  ALT 20  ALKPHOS 47  BILITOT 2.0*  PROT 7.6  ALBUMIN 3.8   No results for input(s): LIPASE, AMYLASE in the last 168 hours. No results for input(s): AMMONIA in the last 168 hours. Coagulation Profile: No results for input(s): INR, PROTIME in the last 168 hours. Cardiac Enzymes: Recent Labs  Lab 11/21/17 0607  CKTOTAL 53   BNP (last 3 results) No results for input(s): PROBNP in the last 8760 hours. HbA1C: No results for input(s): HGBA1C in the last 72 hours. CBG: Recent Labs  Lab 11/22/17 1126 11/22/17 1715 11/22/17 2100 11/23/17 0751 11/23/17 1119  GLUCAP 115* 164* 160* 122* 194*   Lipid Profile: No results for input(s): CHOL, HDL, LDLCALC, TRIG, CHOLHDL, LDLDIRECT in the last 72 hours. Thyroid Function Tests: No results for input(s): TSH, T4TOTAL, FREET4, T3FREE, THYROIDAB in the last 72 hours. Anemia Panel: No results for input(s): VITAMINB12, FOLATE, FERRITIN, TIBC, IRON, RETICCTPCT in the last 72 hours. Sepsis Labs: Recent Labs  Lab 11/19/17 1846  LATICACIDVEN 1.47    Recent Results (from the past 240 hour(s))  Culture, blood (routine x 2)     Status: None (Preliminary result)   Collection Time: 11/19/17  6:37 PM  Result Value Ref Range Status   Specimen Description BLOOD LEFT HAND  Final   Special Requests   Final    BOTTLES DRAWN AEROBIC AND ANAEROBIC Blood Culture adequate volume   Culture   Final    NO GROWTH 4 DAYS Performed at [facility], [place] [address],  [place], [place] [address]    Report Status PENDING  Incomplete  Culture, blood (routine x 2)     Status: None (Preliminary result)   Collection Time: 11/19/17  7:13 PM  Result Value Ref Range Status   Specimen Description BLOOD RIGHT HAND  Final   Special Requests IN PEDIATRIC BOTTLE Blood Culture adequate volume  Final   Culture   Final    NO GROWTH 4 DAYS Performed at [facility], [place] [address] [place], [place] [address]    Report Status PENDING  Incomplete         Radiology Studies: No results found.      Scheduled Meds: . ticagrelor  90 mg Oral BID   And  . aspirin EC  81 mg Oral Daily  . atorvastatin  80 mg Oral q1800  . carvedilol  6.25 mg Oral Daily  . DULoxetine  60 mg Oral Daily  . enoxaparin (LOVENOX) injection  55 mg Subcutaneous QHS  . insulin aspart  0-15 Units Subcutaneous TID WC  . insulin glargine  20 Units  Subcutaneous BID  . losartan  50 mg Oral QPM  . pregabalin  150 mg Oral BID  . protein supplement shake  11 oz Oral Q24H  . tamsulosin  0.4 mg Oral Daily   Continuous Infusions: . ceFEPime (MAXIPIME) IV Stopped (11/23/17 1233)  . vancomycin Stopped (11/23/17 1118)     LOS: 3 days    Time spent: 25 minutes    Arizbeth Cawthorn Jodie Echevaria, MD [facility] Pager [phone]  If 7PM-7AM, please contact night-coverage [email or web address] Password [email or web address] 11/23/2017, 4:22 PM

## 2017-11-23 NOTE — Progress Notes (Signed)
Date: November 23, 2017 Duyen Beckom, BSN, RN3, CCM [phone] Chart and notes review for patient progress and needs. Will follow for case management and discharge needs. Next review date: [id number] 

## 2017-11-24 LAB — CULTURE, BLOOD (ROUTINE X 2)
CULTURE: NO GROWTH
CULTURE: NO GROWTH
SPECIAL REQUESTS: ADEQUATE
Special Requests: ADEQUATE

## 2017-11-24 LAB — GLUCOSE, CAPILLARY
Glucose-Capillary: 123 mg/dL — ABNORMAL HIGH (ref 65–99)
Glucose-Capillary: 167 mg/dL — ABNORMAL HIGH (ref 65–99)

## 2017-11-24 MED ORDER — CLINDAMYCIN HCL 300 MG PO CAPS: 300.0000 mg | ORAL_CAPSULE | Freq: Three times a day (TID) | ORAL | 0 refills | Status: AC

## 2017-11-24 MED ORDER — CLINDAMYCIN HCL 300 MG PO CAPS: 300.0000 mg | ORAL_CAPSULE | Freq: Three times a day (TID) | ORAL | Status: DC

## 2017-11-24 NOTE — Discharge Summary (Signed)
Physician Discharge Summary  Jermaine Jennings OIZ:[id number] DOB: June 26, 1956 DOA: 11/19/2017  PCP: Lavone Orn, MD  Admit date: 11/19/2017 Discharge date: 11/24/2017  Admitted From: Home Disposition: Home  Recommendations for Outpatient Follow-up:  1. Follow up with PCP in 1-2 weeks 2. Please F/U with orthopedics.  Home Health:No Equipment/Devices:None  Discharge Condition: Stable CODE STATUS: Full Diet recommendation:  Carb Modified  Brief/Interim Summary:  Patient is a 57 old male with past medical history of diabetes mellitus, coronary artery disease, hyperlipidemia, hypertension who presented with 3-day history of increasing pain, edema and erythema of his right foot.  Patient was currently being managed for cellulitis of the right foot.  Patient was also found to have fever and white cell count elevation on presentation. Patient started on IV antibiotics and is right foot cellulitis progressively improved.  He was also evaluated by orthopedics.  MRI of the foot did not show any osteomyelitis. Patient  continued to remain afebrile during his hospitalization.  His white cell count stabilized. Blood cultures remain negative. The patient has been discharged today to home to complete the antibiotic course at home.  He will follow-up with his PCP and orthopedics as an outpatient in a week.  Following problems were addressed during his hospitalization:   Diabetic foot infection/cellulitis right lower extremity: MRI of the right foot negative for osteomyelitis.  Orthopedics was following.  He will follow-up with orthopedics as an outpatient. Currently was on  IV antibiotics with vancomycin and cefepime. CRP has trended down. White cell counts are stable.  Patient has remained afebrile. Venous duplex negative for DVT. Antibiotics changed to clindamycin on discharge.  Diabetes mellitus type 2: Uncontrolled.  Hemoglobin A1c on 11/19/17 was 8.4. Continue home regimen on discharge.  He  should follow-up with his PCP/endocrinology for close management of his diabetes.  Hypertension: Continue losartan and carvedilol  Hyperlipidemia: Continue statin  Diabetic polyneuropathy: Continue Lyrica     Discharge Diagnoses:  Principal Problem:   Diabetic infection of right foot ([place]) Active Problems:   Essential hypertension   Diabetes type 2, controlled ([place])   Diabetic ulcer of right great toe ([place])   Cellulitis and abscess of right leg   Uncontrolled type 2 diabetes mellitus with hyperglycemia, with long-term current use of insulin ([place])   Uncontrolled diabetes mellitus with diabetic polyneuropathy, with long-term current use of insulin (H[facility])   Diabetic foot infection ([place])    Discharge Instructions  Discharge Instructions    Diet - low sodium heart healthy   Complete by:  As directed    Discharge instructions   Complete by:  As directed    1)Please follow up with your PCP in a week. 2) Take prescribed medications as instructed 3) follow-up with orthopedics in 1 week.  Name and number of the provider has been attached. If you want to follow-up with your  own orthopedic physician ,you can do that also.   Increase activity slowly   Complete by:  As directed      Allergies as of 11/24/2017      Reactions   Gabapentin Other (See Comments)   Other reaction(s): sleepy sleepy   Indocin [indomethacin]    Other reaction(s): h/a's   Penicillins Rash   Has patient had a PCN reaction causing immediate rash, facial/tongue/throat swelling, SOB or lightheadedness with hypotension:yes Has patient had a PCN reaction causing severe rash involving mucus membranes or skin necrosis:No Has patient had a PCN reaction that required hospitalization:No Has patient had a PCN reaction occurring within  the last 10 years:NO If all of the above answers are "NO", then may proceed with Cephalosporin use.      Medication List    TAKE these medications   acetaminophen 325 MG  tablet Commonly known as:  TYLENOL Take 2 tablets (650 mg total) by mouth every 4 (four) hours as needed for headache or mild pain.   AMBULATORY NON FORMULARY MEDICATION Take 90 mg by mouth 2 (two) times daily. Medication Name: BRILINTA 90 mg BID (TWILIGHT Research Study PROVIDED)   AMBULATORY NON FORMULARY MEDICATION Take 81 mg by mouth daily. Medication Name: Aspirin 81 mg Daily or PLACEBO (TWILIGHT Research study PROVIDED)   atorvastatin 80 MG tablet Commonly known as:  LIPITOR take 1 tablet by mouth once daily AT 6PM   B-12 1000 MCG/ML Kit Inject 1,000 mcg as directed. EVERY 31 DAYS   carvedilol 6.25 MG tablet Commonly known as:  COREG Take 6.25 mg by mouth daily.   clindamycin 300 MG capsule Commonly known as:  CLEOCIN Take 1 capsule (300 mg total) by mouth every 8 (eight) hours for 5 days. Start taking on:  11/25/2017   DULoxetine 60 MG capsule Commonly known as:  CYMBALTA Take 60 mg by mouth daily.   halobetasol 0.05 % cream Commonly known as:  ULTRAVATE Apply 1 application topically 2 (two) times daily as needed (for psoriasis).   ICY HOT BACK EX Apply 1 application topically daily as needed (BACK PAIN).   LANTUS SOLOSTAR 100 UNIT/ML Solostar Pen Generic drug:  Insulin Glargine Inject 40 Units into the skin 2 (two) times daily.   losartan 50 MG tablet Commonly known as:  COZAAR Take 50 mg by mouth every evening.   LYRICA 150 MG capsule Generic drug:  pregabalin Take 150 mg by mouth 2 (two) times daily.   metFORMIN 500 MG 24 hr tablet Commonly known as:  GLUCOPHAGE-XR Take 2 tablets (1,000 mg total) by mouth 2 (two) times daily.   nitroGLYCERIN 0.4 MG SL tablet Commonly known as:  NITROSTAT Place 1 tablet (0.4 mg total) under the tongue every 5 (five) minutes as needed for chest pain.   tamsulosin 0.4 MG Caps capsule Commonly known as:  FLOMAX Take 0.4 mg by mouth every morning.   traMADol 50 MG tablet Commonly known as:  ULTRAM Take 50 mg by mouth  every 6 (six) hours as needed (for pain.).   TRULICITY 1.5 PT/4.6FK Sopn Generic drug:  Dulaglutide Inject 1.5 mg as directed every Sunday.      Follow-up Information    Leandrew Koyanagi, MD. Schedule an appointment as soon as possible for a visit in 1 week(s).   Specialty:  Orthopedic Surgery Contact information: [place] [address] [phone]        Lavone Orn, MD. Schedule an appointment as soon as possible for a visit in 1 week(s).   Specialty:  Internal Medicine Contact information: [address] [place] [address] [phone]          Allergies  Allergen Reactions  . Gabapentin Other (See Comments)    Other reaction(s): sleepy sleepy  . Indocin [Indomethacin]     Other reaction(s): h/a's  . Penicillins Rash    Has patient had a PCN reaction causing immediate rash, facial/tongue/throat swelling, SOB or lightheadedness with hypotension:yes Has patient had a PCN reaction causing severe rash involving mucus membranes or skin necrosis:No Has patient had a PCN reaction that required hospitalization:No Has patient had a PCN reaction occurring within  the last 10 years:NO If all of the above answers are "NO", then may proceed with Cephalosporin use.     Consultations:  Orthopedics   Procedures/Studies: Mr Foot Right W Wo Contrast  Result Date: 11/20/2017 CLINICAL DATA:  Diabetic with right forefoot pain, erythema and swelling for several days. Osteomyelitis suspected. EXAM: MRI OF THE RIGHT FOREFOOT WITHOUT AND WITH CONTRAST TECHNIQUE: Multiplanar, multisequence MR imaging of the right forefoot was performed before and after the administration of intravenous contrast. CONTRAST:  80m MULTIHANCE GADOBENATE DIMEGLUMINE 529 MG/ML IV SOLN COMPARISON:  Radiographs 11/19/2017 and 12/02/2010 FINDINGS: Bones/Joint/Cartilage In correlation with prior radiographs, there is chronic arthropathy involving the interphalangeal  joint of the great toe. There is joint space narrowing with mild subchondral cyst formation. No cortical destruction or suspicious marrow enhancement identified in the great toe. There is some T2 hyperintensity and enhancement within the distal third phalanx. There is no clear corresponding radiographic finding or surrounding soft tissue abnormality in this area. The additional toes and metatarsals appear unremarkable. No significant joint effusions. Ligaments The collateral ligaments at the metatarsal phalangeal joints appear normal. The Lisfranc ligament is intact. Muscles and Tendons Intact flexor and extensor tendons. No significant tenosynovitis. Low-level T2 hyperintensity and enhancement throughout the forefoot musculature, likely diabetic myopathy. Soft tissues Moderate dorsal forefoot subcutaneous edema without suspicious enhancement. There is more extensive edema and heterogeneous enhancement throughout the soft tissues of the great toe, especially along its plantar surface where there are small ill-defined fluid collections. Possible mild skin ulceration along the plantar and lateral aspect of the distal phalanx. IMPRESSION: 1. Findings are consistent with soft tissue infection of the great toe with heterogeneous enhancement and probable small subcutaneous abscesses. 2. No specific evidence of underlying osteomyelitis within the great toe. There are pre-existing chronic arthropathic changes at the interphalangeal joint which have been demonstrated on prior radiographs dating back to 2012. No acute bone destruction or significant joint effusion. 3. Nonspecific T2 hyperintensity and enhancement within the third distal phalanx. This could be posttraumatic. No surrounding soft tissue abnormalities in this area to suggest infection. Electronically Signed   By: WRichardean SaleM.D.   On: 11/20/2017 14:47   UKoreaVenous Img Lower Unilateral Right  Result Date: 11/19/2017 CLINICAL DATA:  Right leg pain EXAM:  RIGHT LOWER EXTREMITY VENOUS DUPLEX ULTRASOUND TECHNIQUE: Doppler venous assessment of the right lower extremity deep venous system was performed, including characterization of spectral flow, compressibility, and phasicity. COMPARISON:  None. FINDINGS: There is complete compressibility of the right common femoral, femoral, and popliteal veins. Doppler analysis demonstrates respiratory phasicity and augmentation of flow with calf compression. No obvious superficial vein or calf vein thrombosis. IMPRESSION: No evidence of right lower extremity DVT. Electronically Signed   By: AMarybelle KillingsM.D.   On: 11/19/2017 11:19   Dg Foot Complete Right  Result Date: 11/19/2017 CLINICAL DATA:  Fall and twisted right foot 2 days ago . Pain and swelling of great toe. Initial encounter. Ankylosing spondylitis. EXAM: RIGHT FOOT COMPLETE - 3+ VIEW COMPARISON:  12/02/2010 FINDINGS: There is no evidence of fracture or dislocation. Advanced arthropathy showing severe joint space narrowing, small cystic lucencies, and degenerative spurring is seen involving the interphalangeal joint of the great toe, which shows mild progression since previous study mild soft tissue swelling great toe also seen. Plantar calcaneal bone spur also noted. IMPRESSION: No evidence of fracture. Progressive severe arthropathy of the great toe interphalangeal joint, with associated soft tissue swelling. Electronically Signed   By: JEarle Gell  M.D.   On: 11/19/2017 09:22      Subjective: Patient seen and examined the bedside this morning.  Remains comfortable.  Right foot cellulitis has improved this morning.  Patient is stable for discharge  Discharge Exam: Vitals:   11/23/17 1424 11/23/17 2051  BP: 124/84 132/81  Pulse: 82 69  Resp: 16 18  Temp: 97.8 F (36.6 C) 98.5 F (36.9 C)  SpO2: 99% 99%   Vitals:   11/22/17 2058 11/23/17 0554 11/23/17 1424 11/23/17 2051  BP: 139/78 127/74 124/84 132/81  Pulse: 73 88 82 69  Resp: 16 16 16 18    Temp: 98.2 F (36.8 C) 98.2 F (36.8 C) 97.8 F (36.6 C) 98.5 F (36.9 C)  TempSrc: Oral Oral Oral Oral  SpO2: 100% 100% 99% 99%  Weight:      Height:        General: Pt is alert, awake, not in acute distress Cardiovascular: RRR, S1/S2 +, no rubs, no gallops Respiratory: CTA bilaterally, no wheezing, no rhonchi Abdominal: Soft, NT, ND, bowel sounds + Extremities: no edema, no cyanosis, erythema of the right toe    The results of significant diagnostics from this hospitalization (including imaging, microbiology, ancillary and laboratory) are listed below for reference.     Microbiology: Recent Results (from the past 240 hour(s))  Culture, blood (routine x 2)     Status: None (Preliminary result)   Collection Time: 11/19/17  6:37 PM  Result Value Ref Range Status   Specimen Description BLOOD LEFT HAND  Final   Special Requests   Final    BOTTLES DRAWN AEROBIC AND ANAEROBIC Blood Culture adequate volume   Culture   Final    NO GROWTH 4 DAYS Performed at [facility], [address], [place], [place] [address]    Report Status PENDING  Incomplete  Culture, blood (routine x 2)     Status: None (Preliminary result)   Collection Time: 11/19/17  7:13 PM  Result Value Ref Range Status   Specimen Description BLOOD RIGHT HAND  Final   Special Requests IN PEDIATRIC BOTTLE Blood Culture adequate volume  Final   Culture   Final    NO GROWTH 4 DAYS Performed at [facility], [place] [address], [place], [place] [address]    Report Status PENDING  Incomplete     Labs: BNP (last 3 results) No results for input(s): BNP in the last 8760 hours. Basic Metabolic Panel: Recent Labs  Lab 11/19/17 1837 11/20/17 0909 11/21/17 0607 11/23/17 0549  NA 137 138 139 140  K 4.9 4.0 3.8 3.8  CL 101 103 107 108  CO2 26 28 25 25   GLUCOSE 150* 206* 99 135*  BUN 15 12 12 13   CREATININE 1.03 1.05 0.96 0.95  CALCIUM 8.9 8.1* 8.6* 8.7*   Liver Function Tests: Recent Labs  Lab  11/19/17 1837  AST 30  ALT 20  ALKPHOS 47  BILITOT 2.0*  PROT 7.6  ALBUMIN 3.8   No results for input(s): LIPASE, AMYLASE in the last 168 hours. No results for input(s): AMMONIA in the last 168 hours. CBC: Recent Labs  Lab 11/19/17 1837 11/20/17 0909 11/21/17 0607 11/22/17 0613 11/23/17 0549  WBC 13.3* 10.2 9.5 9.4 10.3  NEUTROABS 10.1*  --   --   --   --   HGB 12.1* 10.2* 10.4* 10.0* 10.1*  HCT 36.3* 30.9* 32.1* 30.8* 30.8*  MCV 86.6 87.3 87.7 87.3 87.3  PLT 267 236 269 284 323  Cardiac Enzymes: Recent Labs  Lab 11/21/17 0607  CKTOTAL 53   BNP: Invalid input(s): POCBNP CBG: Recent Labs  Lab 11/23/17 1119 11/23/17 1651 11/23/17 2048 11/24/17 0724 11/24/17 1118  GLUCAP 194* 194* 203* 123* 167*   D-Dimer No results for input(s): DDIMER in the last 72 hours. Hgb A1c No results for input(s): HGBA1C in the last 72 hours. Lipid Profile No results for input(s): CHOL, HDL, LDLCALC, TRIG, CHOLHDL, LDLDIRECT in the last 72 hours. Thyroid function studies No results for input(s): TSH, T4TOTAL, T3FREE, THYROIDAB in the last 72 hours.  Invalid input(s): FREET3 Anemia work up No results for input(s): VITAMINB12, FOLATE, FERRITIN, TIBC, IRON, RETICCTPCT in the last 72 hours. Urinalysis No results found for: COLORURINE, APPEARANCEUR, [place], [place], [place], [place], [place], [place], PROTEINUR, UROBILINOGEN, NITRITE, LEUKOCYTESUR Sepsis Labs Invalid input(s): PROCALCITONIN,  WBC,  LACTICIDVEN Microbiology Recent Results (from the past 240 hour(s))  Culture, blood (routine x 2)     Status: None (Preliminary result)   Collection Time: 11/19/17  6:37 PM  Result Value Ref Range Status   Specimen Description BLOOD LEFT HAND  Final   Special Requests   Final    BOTTLES DRAWN AEROBIC AND ANAEROBIC Blood Culture adequate volume   Culture   Final    NO GROWTH 4 DAYS Performed at [facility], [address] [place], [place] [address]    Report Status  PENDING  Incomplete  Culture, blood (routine x 2)     Status: None (Preliminary result)   Collection Time: 11/19/17  7:13 PM  Result Value Ref Range Status   Specimen Description BLOOD RIGHT HAND  Final   Special Requests IN PEDIATRIC BOTTLE Blood Culture adequate volume  Final   Culture   Final    NO GROWTH 4 DAYS Performed at [facility], [place] [address] [place], [place] [address]    Report Status PENDING  Incomplete     Time coordinating discharge: Over 30 minutes  SIGNED:   Marene Lenz, MD  [facility] 11/24/2017, 12:07 PM Pager [phone]  If 7PM-7AM, please contact night-coverage [email or web address] Password [email or web address]

## 2017-11-30 DIAGNOSIS — E538 Deficiency of other specified B group vitamins: Secondary | ICD-10-CM | POA: Diagnosis not present

## 2017-11-30 DIAGNOSIS — E11621 Type 2 diabetes mellitus with foot ulcer: Secondary | ICD-10-CM | POA: Diagnosis not present

## 2017-11-30 DIAGNOSIS — L03115 Cellulitis of right lower limb: Secondary | ICD-10-CM | POA: Diagnosis not present

## 2017-12-02 ENCOUNTER — Ambulatory Visit (INDEPENDENT_AMBULATORY_CARE_PROVIDER_SITE_OTHER): Payer: BLUE CROSS/BLUE SHIELD | Admitting: Family

## 2017-12-02 ENCOUNTER — Encounter (INDEPENDENT_AMBULATORY_CARE_PROVIDER_SITE_OTHER): Payer: Self-pay | Admitting: Family

## 2017-12-02 VITALS — Ht 73.0 in | Wt 250.0 lb

## 2017-12-02 DIAGNOSIS — L089 Local infection of the skin and subcutaneous tissue, unspecified: Secondary | ICD-10-CM | POA: Diagnosis not present

## 2017-12-02 DIAGNOSIS — L97519 Non-pressure chronic ulcer of other part of right foot with unspecified severity: Secondary | ICD-10-CM

## 2017-12-02 DIAGNOSIS — E11628 Type 2 diabetes mellitus with other skin complications: Secondary | ICD-10-CM

## 2017-12-02 DIAGNOSIS — E11621 Type 2 diabetes mellitus with foot ulcer: Secondary | ICD-10-CM

## 2017-12-02 MED ORDER — DOXYCYCLINE HYCLATE 100 MG PO TABS: 100.0000 mg | ORAL_TABLET | Freq: Two times a day (BID) | ORAL | 0 refills | Status: DC

## 2017-12-02 NOTE — Progress Notes (Signed)
Office Visit Note   Patient: Jermaine Jennings           Date of Birth: [birth year]           MRN: [id number] Visit Date: 12/02/2017              Requested by: Lavone Orn, MD [address] [place], [place] [address] PCP: Lavone Orn, MD  Chief Complaint  Patient presents with  . Right Foot - Follow-up    ER 11/20/17      HPI: The patient is a 62 year old gentleman who presents today in hospital follow-up.  He was hospitalized for diabetic foot ulcer to his right great toe.  An MRI was done on December 28 this was negative for osteomyelitis.  Dr. to follow during hospital course.  Did complete a course of vancomycin and cefepime IV. Antibiotics changed to clindamycin on discharge.  Did complete this course.  Today feels well however did have chills in the last 2 nights.  Has not checked his temperature.   Assessment & Plan: Visit Diagnoses:  1. Diabetic infection of right foot ([place])   2. Diabetic ulcer of right great toe [facility])     Plan: Will resume oral antibiotics with doxycycline.  Bacitracin dressing changes to the great toe ulcer.  Will follow up in 1 week.  Follow-Up Instructions: Return in about 1 week (around 12/09/2017).   Ortho Exam  Patient is alert, oriented, no adenopathy, well-dressed, normal affect, normal respiratory effort. On examination of the right foot there is erythema and swelling to the great toe. no warmth.  Does have 2 plantar ulcerative area is these are 4 mm in diameter do not probe.  The callus was pared with a 10 blade knife following informed consent back to viable tissue. there is no purulence no odor.  No ascending cellulitis.  Imaging: No results found. No images are attached to the encounter.  Labs: Lab Results  Component Value Date   HGBA1C 8.4 (H) 11/19/2017   ESRSEDRATE 67 (H) 11/20/2017   ESRSEDRATE 63 (H) 11/19/2017   CRP 4.4 (H) 11/23/2017   CRP 13.4 (H) 11/19/2017   REPTSTATUS 11/24/2017 FINAL 11/19/2017   CULT  11/19/2017    NO GROWTH 5 DAYS Performed at [facility], [place] [address], [place], [place] [address]     @[email or web address] [phone]  Body mass index is 32.98 kg/m.  Orders:  No orders of the defined types were placed in this encounter.  Meds ordered this encounter  Medications  . doxycycline (VIBRA-TABS) 100 MG tablet    Sig: Take 1 tablet (100 mg total) by mouth 2 (two) times daily.    Dispense:  28 tablet    Refill:  0     Procedures: No procedures performed  Clinical Data: No additional findings.  ROS:  All other systems negative, except as noted in the HPI. Review of Systems  Constitutional: Negative for chills and fever.  Cardiovascular: Negative for leg swelling.  Skin: Positive for color change and wound.    Objective: Vital Signs: Ht 6\' 1"  (1.854 m)   Wt 250 lb (113.4 kg)   BMI 32.98 kg/m   Specialty Comments:  No specialty comments available.  PMFS History: Patient Active Problem List   Diagnosis Date Noted  . Cellulitis and abscess of right leg 11/20/2017  . Uncontrolled type 2 diabetes mellitus with hyperglycemia, with long-term current use of insulin ([place]) 11/20/2017  . Uncontrolled diabetes mellitus with diabetic polyneuropathy, with long-term  current use of insulin ([place]) 11/20/2017  . Diabetic infection of right foot ([place]) 11/19/2017  . Diabetic ulcer of right great toe ([place]) 11/19/2017  . CAD in native artery 10/04/2016  . S/P angioplasty with stent, 10/03/16 DES to [place] to Diag 10/04/2016  . Asymptomatic LV dysfunction EF by cath 45-50%  10/04/2016  . Diabetes type 2, controlled ([place]) 10/04/2016  . Research study patient 10/04/2016  . Unstable angina ([place])   . Vitamin B12 deficiency (non anemic) 09/29/2016  . Benign neoplasm of colon 09/29/2016  . Eosinophilic esophagitis 83/41/9622  . Erectile dysfunction 09/29/2016  . Essential hypertension 09/29/2016  . Mixed hyperlipidemia 09/29/2016  . Obesity 09/29/2016  .  Peripheral axonal neuropathy 09/29/2016  . Polyneuropathy due to type 2 diabetes mellitus ([place]) 09/29/2016  . Psoriasis 09/29/2016  . Psoriasis with arthropathy ([place]) 09/29/2016  . Pure hypertriglyceridemia 09/29/2016  . Uncomplicated asthma 29/79/8921   Past Medical History:  Diagnosis Date  . Asthma   . Asymptomatic LV dysfunction 10/04/2016  . Benign essential HTN   . CAD in native artery 10/04/2016  . Complication of anesthesia    " DIFFICULTY BREATHING ONE TIME "  . Coronary artery disease   . Diabetes type 2, controlled ([place]) 10/04/2016  . DM (diabetes mellitus), type 2 ([place])   . Erectile dysfunction   . Hypertriglyceridemia   . Psoriatic arthritis ([place])   . Research study patient 10/04/2016  . Rotator cuff disorder   . S/P angioplasty with stent, 10/03/16 DES to [place] to Diag 10/04/2016  . Vitamin B 12 deficiency     Family History  Problem Relation Age of Onset  . Heart disease Mother   . Hypertension Mother   . Breast cancer Mother   . Glaucoma Mother   . CAD Mother   . CVA Father   . Hypertension Father   . Neuropathy Father   . Other Father        carotid endarterectomy  . Hypertension Brother   . Diabetes Brother   . Arthritis Maternal Grandfather   . Healthy Son   . Healthy Son     Past Surgical History:  Procedure Laterality Date  . APPENDECTOMY  1999  . CARDIAC CATHETERIZATION N/A 10/03/2016   Procedure: Left Heart Cath and Coronary Angiography;  Surgeon: Burnell Blanks, MD;  Location: [facility];  Service: Cardiovascular;  Laterality: N/A;  . CARDIAC CATHETERIZATION N/A 10/03/2016   Procedure: Coronary Stent Intervention;  Surgeon: Burnell Blanks, MD;  Location: [facility];  Service: Cardiovascular;  Laterality: N/A;  . colon polyps    . CORONARY STENT PLACEMENT  10/03/2016   Severe stenosis proximal moderate caliber co-dominant RCA now s/p successful PTCA/DES x 1 proximal RCA.   Marland Kitchen KNEE SURGERY Right 1975    ligament and spurs  . KNEE SURGERY Left 1980   ligaments and spurs  . TOTAL KNEE ARTHROPLASTY  1990's   Social History   Occupational History  . Not on file  Tobacco Use  . Smoking status: Never Smoker  . Smokeless tobacco: Never Used  Substance and Sexual Activity  . Alcohol use: Yes    Comment: rarely  . Drug use: No  . Sexual activity: Not on file

## 2017-12-10 ENCOUNTER — Encounter (INDEPENDENT_AMBULATORY_CARE_PROVIDER_SITE_OTHER): Payer: Self-pay | Admitting: Family

## 2017-12-10 ENCOUNTER — Ambulatory Visit (INDEPENDENT_AMBULATORY_CARE_PROVIDER_SITE_OTHER): Payer: BLUE CROSS/BLUE SHIELD | Admitting: Family

## 2017-12-10 DIAGNOSIS — E11621 Type 2 diabetes mellitus with foot ulcer: Secondary | ICD-10-CM

## 2017-12-10 DIAGNOSIS — L97519 Non-pressure chronic ulcer of other part of right foot with unspecified severity: Secondary | ICD-10-CM

## 2017-12-10 NOTE — Progress Notes (Signed)
Office Visit Note   Patient: Jermaine Jennings           Date of Birth: 03-08-1956           MRN: [id number] Visit Date: 12/10/2017              Requested by: Lavone Orn, MD [address] [place], [place] [address] PCP: Lavone Orn, MD  Chief Complaint  Patient presents with  . Right Great Toe - Follow-up      HPI: The patient is a 62 year old gentleman who presents today follow-up.  He was hospitalized for diabetic foot ulcer to his right great toe.  An MRI was done on December 28 this was negative for osteomyelitis.  Dr. to follow during hospital course.  Did complete a course of vancomycin and cefepime IV. Antibiotics changed to clindamycin on discharge.  Did complete this course. Has since been on Doxycycline for 1 week, is still completing course.   Today feels well. No concerns. Voiced. Doing bactroban dressing changes.    Assessment & Plan: Visit Diagnoses:  No diagnosis found.  Plan: He will continue with  Bactroban dressing changes to the great toe ulcer.  Will follow up in 4 weeks if continued ulcerations or issues. Discussed strict return precautions and warning signs. MRI was reassuring. Ulcerations nearly healed.   Follow-Up Instructions: No Follow-up on file.   Ortho Exam  Patient is alert, oriented, no adenopathy, well-dressed, normal affect, normal respiratory effort. On examination of the right foot there is mild erythema and swelling to the great toe. Patient reports is normal appearance to him. No warmth.  Does have 2 plantar ulcerative area is these are 2 mm in diameter. No depth. do not probe.  The callus was pared with a 10 blade knife following informed consent back to viable tissue. there is no purulence no odor.  No ascending cellulitis.  Imaging: No results found. No images are attached to the encounter.  Labs: Lab Results  Component Value Date   HGBA1C 8.4 (H) 11/19/2017   ESRSEDRATE 67 (H) 11/20/2017   ESRSEDRATE 63 (H)  11/19/2017   CRP 4.4 (H) 11/23/2017   CRP 13.4 (H) 11/19/2017   REPTSTATUS 11/24/2017 FINAL 11/19/2017   CULT  11/19/2017    NO GROWTH 5 DAYS Performed at [facility], [place] [address] [place], [place] [address]     @[email or web address] ([email or web address])@  There is no height or weight on file to calculate BMI.  Orders:  No orders of the defined types were placed in this encounter.  No orders of the defined types were placed in this encounter.    Procedures: No procedures performed  Clinical Data: No additional findings.  ROS:  All other systems negative, except as noted in the HPI. Review of Systems  Constitutional: Negative for chills and fever.  Cardiovascular: Negative for leg swelling.  Skin: Positive for color change and wound.    Objective: Vital Signs: There were no vitals taken for this visit.  Specialty Comments:  No specialty comments available.  PMFS History: Patient Active Problem List   Diagnosis Date Noted  . Cellulitis and abscess of right leg 11/20/2017  . Uncontrolled type 2 diabetes mellitus with hyperglycemia, with long-term current use of insulin ([place]) 11/20/2017  . Uncontrolled diabetes mellitus with diabetic polyneuropathy, with long-term current use of insulin ([place]) 11/20/2017  . Diabetic infection of right foot ([place]) 11/19/2017  . Diabetic ulcer of right great toe ([place]) 11/19/2017  . CAD in  native artery 10/04/2016  . S/P angioplasty with stent, 10/03/16 DES to [place] to Diag 10/04/2016  . Asymptomatic LV dysfunction EF by cath 45-50%  10/04/2016  . Diabetes type 2, controlled ([place]) 10/04/2016  . Research study patient 10/04/2016  . Unstable angina ([place])   . Vitamin B12 deficiency (non anemic) 09/29/2016  . Benign neoplasm of colon 09/29/2016  . Eosinophilic esophagitis 92/09/9416  . Erectile dysfunction 09/29/2016  . Essential hypertension 09/29/2016  . Mixed hyperlipidemia 09/29/2016  . Obesity 09/29/2016  . Peripheral axonal neuropathy  09/29/2016  . Polyneuropathy due to type 2 diabetes mellitus ([place]) 09/29/2016  . Psoriasis 09/29/2016  . Psoriasis with arthropathy ([place]) 09/29/2016  . Pure hypertriglyceridemia 09/29/2016  . Uncomplicated asthma 40/81/4481   Past Medical History:  Diagnosis Date  . Asthma   . Asymptomatic LV dysfunction 10/04/2016  . Benign essential HTN   . CAD in native artery 10/04/2016  . Complication of anesthesia    " DIFFICULTY BREATHING ONE TIME "  . Coronary artery disease   . Diabetes type 2, controlled ([place]) 10/04/2016  . DM (diabetes mellitus), type 2 ([place])   . Erectile dysfunction   . Hypertriglyceridemia   . Psoriatic arthritis ([place])   . Research study patient 10/04/2016  . Rotator cuff disorder   . S/P angioplasty with stent, 10/03/16 DES to [place] to Diag 10/04/2016  . Vitamin B 12 deficiency     Family History  Problem Relation Age of Onset  . Heart disease Mother   . Hypertension Mother   . Breast cancer Mother   . Glaucoma Mother   . CAD Mother   . CVA Father   . Hypertension Father   . Neuropathy Father   . Other Father        carotid endarterectomy  . Hypertension Brother   . Diabetes Brother   . Arthritis Maternal Grandfather   . Healthy Son   . Healthy Son     Past Surgical History:  Procedure Laterality Date  . APPENDECTOMY  1999  . CARDIAC CATHETERIZATION N/A 10/03/2016   Procedure: Left Heart Cath and Coronary Angiography;  Surgeon: Burnell Blanks, MD;  Location: [facility];  Service: Cardiovascular;  Laterality: N/A;  . CARDIAC CATHETERIZATION N/A 10/03/2016   Procedure: Coronary Stent Intervention;  Surgeon: Burnell Blanks, MD;  Location: [facility];  Service: Cardiovascular;  Laterality: N/A;  . colon polyps    . CORONARY STENT PLACEMENT  10/03/2016   Severe stenosis proximal moderate caliber co-dominant RCA now s/p successful PTCA/DES x 1 proximal RCA.   Marland Kitchen KNEE SURGERY Right 1975   ligament and spurs  . KNEE  SURGERY Left 1980   ligaments and spurs  . TOTAL KNEE ARTHROPLASTY  1990's   Social History   Occupational History  . Not on file  Tobacco Use  . Smoking status: Never Smoker  . Smokeless tobacco: Never Used  Substance and Sexual Activity  . Alcohol use: Yes    Comment: rarely  . Drug use: No  . Sexual activity: Not on file

## 2017-12-24 DIAGNOSIS — Z1589 Genetic susceptibility to other disease: Secondary | ICD-10-CM | POA: Diagnosis not present

## 2017-12-24 DIAGNOSIS — L409 Psoriasis, unspecified: Secondary | ICD-10-CM | POA: Diagnosis not present

## 2017-12-24 DIAGNOSIS — M2550 Pain in unspecified joint: Secondary | ICD-10-CM | POA: Diagnosis not present

## 2017-12-24 DIAGNOSIS — L4059 Other psoriatic arthropathy: Secondary | ICD-10-CM | POA: Diagnosis not present

## 2017-12-31 DIAGNOSIS — E538 Deficiency of other specified B group vitamins: Secondary | ICD-10-CM | POA: Diagnosis not present

## 2017-12-31 DIAGNOSIS — Z794 Long term (current) use of insulin: Secondary | ICD-10-CM | POA: Diagnosis not present

## 2017-12-31 DIAGNOSIS — E1142 Type 2 diabetes mellitus with diabetic polyneuropathy: Secondary | ICD-10-CM | POA: Diagnosis not present

## 2017-12-31 DIAGNOSIS — G608 Other hereditary and idiopathic neuropathies: Secondary | ICD-10-CM | POA: Diagnosis not present

## 2017-12-31 DIAGNOSIS — I10 Essential (primary) hypertension: Secondary | ICD-10-CM | POA: Diagnosis not present

## 2018-01-12 ENCOUNTER — Encounter: Payer: BLUE CROSS/BLUE SHIELD | Admitting: *Deleted

## 2018-01-12 DIAGNOSIS — Z006 Encounter for examination for normal comparison and control in clinical research program: Secondary | ICD-10-CM

## 2018-01-12 MED ORDER — ASPIRIN EC 81 MG PO TBEC: 81.0000 mg | DELAYED_RELEASE_TABLET | Freq: Every day | ORAL | 3 refills | Status: AC

## 2018-01-12 MED ORDER — CLOPIDOGREL BISULFATE 75 MG PO TABS: 75.0000 mg | ORAL_TABLET | Freq: Every day | ORAL | 3 refills | Status: DC

## 2018-01-12 NOTE — Progress Notes (Signed)
TWILIGHT Research study month 15 follow up visit completed. Patient denies any bleeding events. After pill count patient has been 99 % compliant with both ASA/Placebo and Brilinta. Patient has been instructed to start Plavix 75 mg daily and ASA 81 mg daily as soon as he picks up script. (I have given him a few remaining Brilinta ). Questions encouraged and answered.

## 2018-02-16 ENCOUNTER — Encounter: Payer: Self-pay | Admitting: Cardiovascular Disease

## 2018-02-17 ENCOUNTER — Encounter: Payer: Self-pay | Admitting: Cardiovascular Disease

## 2018-02-17 DIAGNOSIS — E538 Deficiency of other specified B group vitamins: Secondary | ICD-10-CM | POA: Diagnosis not present

## 2018-02-24 DIAGNOSIS — Z1589 Genetic susceptibility to other disease: Secondary | ICD-10-CM | POA: Diagnosis not present

## 2018-02-24 DIAGNOSIS — L4059 Other psoriatic arthropathy: Secondary | ICD-10-CM | POA: Diagnosis not present

## 2018-02-24 DIAGNOSIS — M2550 Pain in unspecified joint: Secondary | ICD-10-CM | POA: Diagnosis not present

## 2018-02-24 DIAGNOSIS — L409 Psoriasis, unspecified: Secondary | ICD-10-CM | POA: Diagnosis not present

## 2018-03-01 ENCOUNTER — Ambulatory Visit: Payer: BLUE CROSS/BLUE SHIELD | Admitting: Cardiovascular Disease

## 2018-03-11 ENCOUNTER — Encounter: Payer: Self-pay | Admitting: Cardiovascular Disease

## 2018-03-11 ENCOUNTER — Ambulatory Visit (INDEPENDENT_AMBULATORY_CARE_PROVIDER_SITE_OTHER): Payer: BLUE CROSS/BLUE SHIELD | Admitting: Cardiovascular Disease

## 2018-03-11 VITALS — BP 126/70 | HR 86 | Ht 73.0 in | Wt 254.0 lb

## 2018-03-11 DIAGNOSIS — I10 Essential (primary) hypertension: Secondary | ICD-10-CM

## 2018-03-11 DIAGNOSIS — I2510 Atherosclerotic heart disease of native coronary artery without angina pectoris: Secondary | ICD-10-CM

## 2018-03-11 NOTE — Progress Notes (Signed)
Chief Complaint  Patient presents with  . Coronary Artery Disease     History of Present Illness: 62 yo male with history of DM, HTN, HLD, CAD who is here today for cardiac follow up. He was admitted to [facility] November 2017 with unstable angina. He underwent cardiac cath 10/03/16 and was found to have severe stenosis in the proximal RCA, Diagonal and CTO of the OM branch. Drug eluting stents were placed in the RCA and Diagonal branches. LVEF=45-50%. Echo 12/31/65 showed LV systolic was normal 01-10%. He had BP on the lower side when starting cardiac rehab so his Coreg and Cozaar doses were reduced.   He is here today for follow up. The patient denies any chest pain, dyspnea, palpitations, lower extremity edema, orthopnea, PND, dizziness, near syncope or syncope.   Primary Care Physician: Lavone Orn, MD  Past Medical History:  Diagnosis Date  . Asthma   . Asymptomatic LV dysfunction 10/04/2016  . Benign essential HTN   . CAD in native artery 10/04/2016  . Complication of anesthesia    " DIFFICULTY BREATHING ONE TIME "  . Coronary artery disease   . Diabetes type 2, controlled ([place]) 10/04/2016  . DM (diabetes mellitus), type 2 ([place])   . Erectile dysfunction   . Hypertriglyceridemia   . Psoriatic arthritis ([place])   . Research study patient 10/04/2016  . Rotator cuff disorder   . S/P angioplasty with stent, 10/03/16 DES to [place] to Diag 10/04/2016  . Vitamin B 12 deficiency     Past Surgical History:  Procedure Laterality Date  . APPENDECTOMY  1999  . CARDIAC CATHETERIZATION N/A 10/03/2016   Procedure: Left Heart Cath and Coronary Angiography;  Surgeon: Burnell Blanks, MD;  Location: [facility];  Service: Cardiovascular;  Laterality: N/A;  . CARDIAC CATHETERIZATION N/A 10/03/2016   Procedure: Coronary Stent Intervention;  Surgeon: Burnell Blanks, MD;  Location: [facility];  Service: Cardiovascular;  Laterality: N/A;  . colon polyps    .  CORONARY STENT PLACEMENT  10/03/2016   Severe stenosis proximal moderate caliber co-dominant RCA now s/p successful PTCA/DES x 1 proximal RCA.   Marland Kitchen KNEE SURGERY Right 1975   ligament and spurs  . KNEE SURGERY Left 1980   ligaments and spurs  . TOTAL KNEE ARTHROPLASTY  1990's    Current Outpatient Medications  Medication Sig Dispense Refill  . acetaminophen (TYLENOL) 325 MG tablet Take 2 tablets (650 mg total) by mouth every 4 (four) hours as needed for headache or mild pain.    Marland Kitchen aspirin EC 81 MG tablet Take 1 tablet (81 mg total) by mouth daily. 90 tablet 3  . atorvastatin (LIPITOR) 80 MG tablet take 1 tablet by mouth once daily AT 6PM 30 tablet 8  . carvedilol (COREG) 6.25 MG tablet Take 6.25 mg by mouth daily.     . clopidogrel (PLAVIX) 75 MG tablet Take 1 tablet (75 mg total) by mouth daily. 90 tablet 3  . Cyanocobalamin (B-12) 1000 MCG/ML KIT Inject 1,000 mcg as directed. EVERY 31 DAYS    . DULoxetine (CYMBALTA) 60 MG capsule Take 60 mg by mouth daily.    . halobetasol (ULTRAVATE) 0.05 % cream Apply 1 application topically 2 (two) times daily as needed (for psoriasis).     . Insulin Glargine (LANTUS SOLOSTAR) 100 UNIT/ML Solostar Pen Inject 40 Units into the skin 2 (two) times daily.     Marland Kitchen losartan (COZAAR) 50 MG tablet Take 50 mg  by mouth every evening.   1  . LYRICA 150 MG capsule Take 150 mg by mouth 2 (two) times daily.  0  . Menthol, Topical Analgesic, (ICY HOT BACK EX) Apply 1 application topically daily as needed (BACK PAIN).    Marland Kitchen metFORMIN (GLUCOPHAGE-XR) 500 MG 24 hr tablet Take 2 tablets (1,000 mg total) by mouth 2 (two) times daily.  1  . nitroGLYCERIN (NITROSTAT) 0.4 MG SL tablet Place 1 tablet (0.4 mg total) under the tongue every 5 (five) minutes as needed for chest pain. 25 tablet 12  . tamsulosin (FLOMAX) 0.4 MG CAPS capsule Take 0.4 mg by mouth every morning.    . traMADol (ULTRAM) 50 MG tablet Take 50 mg by mouth every 6 (six) hours as needed (for pain.).    Marland Kitchen  TRULICITY 1.5 HL/4.5GY SOPN Inject 1.5 mg as directed every Sunday.   1   No current facility-administered medications for this visit.     Allergies  Allergen Reactions  . Gabapentin Other (See Comments)    Other reaction(s): sleepy sleepy  . Indocin [Indomethacin]     Other reaction(s): h/a's  . Penicillins Rash    Has patient had a PCN reaction causing immediate rash, facial/tongue/throat swelling, SOB or lightheadedness with hypotension:yes Has patient had a PCN reaction causing severe rash involving mucus membranes or skin necrosis:No Has patient had a PCN reaction that required hospitalization:No Has patient had a PCN reaction occurring within the last 10 years:NO If all of the above answers are "NO", then may proceed with Cephalosporin use.     Social History   Socioeconomic History  . Marital status: Married    Spouse name: Not on file  . Number of children: Not on file  . Years of education: Not on file  . Highest education level: Not on file  Occupational History  . Not on file  Social Needs  . Financial resource strain: Not on file  . Food insecurity:    Worry: Not on file    Inability: Not on file  . Transportation needs:    Medical: Not on file    Non-medical: Not on file  Tobacco Use  . Smoking status: Never Smoker  . Smokeless tobacco: Never Used  Substance and Sexual Activity  . Alcohol use: Yes    Comment: rarely  . Drug use: No  . Sexual activity: Not on file  Lifestyle  . Physical activity:    Days per week: Not on file    Minutes per session: Not on file  . Stress: Not on file  Relationships  . Social connections:    Talks on phone: Not on file    Gets together: Not on file    Attends religious service: Not on file    Active member of club or organization: Not on file    Attends meetings of clubs or organizations: Not on file    Relationship status: Not on file  . Intimate partner violence:    Fear of current or ex partner: Not on file      Emotionally abused: Not on file    Physically abused: Not on file    Forced sexual activity: Not on file  Other Topics Concern  . Not on file  Social History Narrative  . Not on file    Family History  Problem Relation Age of Onset  . Heart disease Mother   . Hypertension Mother   . Breast cancer Mother   . Glaucoma Mother   .  CAD Mother   . CVA Father   . Hypertension Father   . Neuropathy Father   . Other Father        carotid endarterectomy  . Hypertension Brother   . Diabetes Brother   . Arthritis Maternal Grandfather   . Healthy Son   . Healthy Son     Review of Systems:  As stated in the HPI and otherwise negative.   BP 126/70   Pulse 86   Ht 6' 1" (1.854 m)   Wt 254 lb (115.2 kg)   SpO2 96%   BMI 33.51 kg/m   Physical Examination:  General: Well developed, well nourished, NAD  HEENT: OP clear, mucus membranes moist  SKIN: warm, dry. No rashes. Neuro: No focal deficits  Musculoskeletal: Muscle strength 5/5 all ext  Psychiatric: Mood and affect normal  Neck: No JVD, no carotid bruits, no thyromegaly, no lymphadenopathy.  Lungs:Clear bilaterally, no wheezes, rhonci, crackles Cardiovascular: Regular rate and rhythm. No murmurs, gallops or rubs. Abdomen:Soft. Bowel sounds present. Non-tender.  Extremities: No lower extremity edema. Pulses are 2 + in the bilateral DP/PT.  Echo 12/26/16: Left ventricle: The cavity size was normal. Wall thickness was   increased in a pattern of mild LVH. Systolic function was normal.   The estimated ejection fraction was in the range of 55% to 60%.   Wall motion was normal; there were no regional wall motion   abnormalities. Left ventricular diastolic function parameters   were normal. - Left atrium: The atrium was mildly dilated. - Atrial septum: No defect or patent foramen ovale was identified.  EKG:  EKG is not ordered today. The ekg ordered today demonstrates   Recent Labs: 11/19/2017: ALT 20 11/23/2017: BUN 13;  Creatinine, Ser 0.95; Hemoglobin 10.1; Platelets 323; Potassium 3.8; Sodium 140   Lipid Panel    Component Value Date/Time   CHOL 105 02/02/2017 0749   TRIG 129 02/02/2017 0749   HDL 34 (L) 02/02/2017 0749   CHOLHDL 3.1 02/02/2017 0749   LDLCALC 45 02/02/2017 0749     Wt Readings from Last 3 Encounters:  03/11/18 254 lb (115.2 kg)  12/02/17 250 lb (113.4 kg)  11/19/17 250 lb 6 oz (113.6 kg)     Other studies Reviewed: Additional studies/ records that were reviewed today include: . Review of the above records demonstrates:   Assessment and Plan:   1. CAD without angina: He had PCI in November 2017 with drug eluting stents placed in the RCA and Diagonal. He has a CTO of the OM branch. No chest pain suggestive of angina. Will continue ASA, Plavix, statin and beta blocker. LDL is under 40 by check in primary care in October 2018.    2. HTN: BP is controlled.   No changes.   The patient does not have concerns regarding medicines.  The following changes have been made:  no change  Labs/ tests ordered today include:   No orders of the defined types were placed in this encounter.   Disposition:   FU with me in 6 months  Signed, Lauree Chandler, MD 03/11/2018 3:29 PM    [address], [place], [place]  [address] Phone: [phone]; Fax: [phone]

## 2018-03-11 NOTE — Patient Instructions (Signed)

## 2018-03-30 ENCOUNTER — Encounter: Payer: Self-pay | Admitting: *Deleted

## 2018-03-30 DIAGNOSIS — Z006 Encounter for examination for normal comparison and control in clinical research program: Secondary | ICD-10-CM

## 2018-03-30 NOTE — Progress Notes (Signed)
TWILIGHT Research study month 18 telephone follow up completed. Patient states that he stopped ASA 81 mg on 03/11/18 when he had office visit with Dr. Angelena Form because he thought he instructed him to stop. According to the office note No changes were made to any of his medication. I have instructed patient to call office to get confirmation. I will route this note to cardiology staff for feed back and instuct patient accordingly. I thanked the patient for his participation and will forward the Research results once we obtain.

## 2018-04-01 ENCOUNTER — Telehealth: Payer: Self-pay | Admitting: *Deleted

## 2018-04-01 NOTE — Telephone Encounter (Signed)
Jermaine Blanks, MD  Karilyn Cota, RN  Cc: Thompson Grayer, RN        [name], If he is having no issues with bleeding, we can continue ASA along with Plavix. If he is having excessive bruising or any bleeding, I would stop ASA. Thanks, chris   I spoke with pt and gave him information from Dr. Angelena Form.  He is not having bleeding or bruising problems and will take ASA 81 mg daily.

## 2018-05-04 DIAGNOSIS — E1165 Type 2 diabetes mellitus with hyperglycemia: Secondary | ICD-10-CM | POA: Diagnosis not present

## 2018-05-04 DIAGNOSIS — E538 Deficiency of other specified B group vitamins: Secondary | ICD-10-CM | POA: Diagnosis not present

## 2018-05-04 DIAGNOSIS — Z794 Long term (current) use of insulin: Secondary | ICD-10-CM | POA: Diagnosis not present

## 2018-05-04 DIAGNOSIS — E1142 Type 2 diabetes mellitus with diabetic polyneuropathy: Secondary | ICD-10-CM | POA: Diagnosis not present

## 2018-05-04 DIAGNOSIS — I2510 Atherosclerotic heart disease of native coronary artery without angina pectoris: Secondary | ICD-10-CM | POA: Diagnosis not present

## 2018-05-04 DIAGNOSIS — G608 Other hereditary and idiopathic neuropathies: Secondary | ICD-10-CM | POA: Diagnosis not present

## 2018-06-07 DIAGNOSIS — L409 Psoriasis, unspecified: Secondary | ICD-10-CM | POA: Diagnosis not present

## 2018-06-07 DIAGNOSIS — Z1589 Genetic susceptibility to other disease: Secondary | ICD-10-CM | POA: Diagnosis not present

## 2018-06-07 DIAGNOSIS — L4059 Other psoriatic arthropathy: Secondary | ICD-10-CM | POA: Diagnosis not present

## 2018-08-03 ENCOUNTER — Other Ambulatory Visit: Payer: Self-pay | Admitting: Cardiovascular Disease

## 2018-08-03 MED ORDER — ATORVASTATIN CALCIUM 80 MG PO TABS: ORAL_TABLET | ORAL | 6 refills | Status: DC

## 2018-08-05 DIAGNOSIS — E538 Deficiency of other specified B group vitamins: Secondary | ICD-10-CM | POA: Diagnosis not present

## 2018-08-05 DIAGNOSIS — Z23 Encounter for immunization: Secondary | ICD-10-CM | POA: Diagnosis not present

## 2018-09-03 DIAGNOSIS — E782 Mixed hyperlipidemia: Secondary | ICD-10-CM | POA: Diagnosis not present

## 2018-09-03 DIAGNOSIS — Z0000 Encounter for general adult medical examination without abnormal findings: Secondary | ICD-10-CM | POA: Diagnosis not present

## 2018-09-03 DIAGNOSIS — I10 Essential (primary) hypertension: Secondary | ICD-10-CM | POA: Diagnosis not present

## 2018-09-03 DIAGNOSIS — G608 Other hereditary and idiopathic neuropathies: Secondary | ICD-10-CM | POA: Diagnosis not present

## 2018-09-03 DIAGNOSIS — I2510 Atherosclerotic heart disease of native coronary artery without angina pectoris: Secondary | ICD-10-CM | POA: Diagnosis not present

## 2018-09-03 DIAGNOSIS — E538 Deficiency of other specified B group vitamins: Secondary | ICD-10-CM | POA: Diagnosis not present

## 2018-09-03 DIAGNOSIS — E1142 Type 2 diabetes mellitus with diabetic polyneuropathy: Secondary | ICD-10-CM | POA: Diagnosis not present

## 2018-09-07 DIAGNOSIS — L4059 Other psoriatic arthropathy: Secondary | ICD-10-CM | POA: Diagnosis not present

## 2018-09-07 DIAGNOSIS — M2550 Pain in unspecified joint: Secondary | ICD-10-CM | POA: Diagnosis not present

## 2018-09-07 DIAGNOSIS — L409 Psoriasis, unspecified: Secondary | ICD-10-CM | POA: Diagnosis not present

## 2018-09-07 DIAGNOSIS — Z1589 Genetic susceptibility to other disease: Secondary | ICD-10-CM | POA: Diagnosis not present

## 2018-09-30 NOTE — Progress Notes (Signed)
Chief Complaint  Patient presents with  . Follow-up    CAD     History of Present Illness: 62 yo male with history of DM, HTN, HLD and CAD who is here today for cardiac follow up. He was admitted to [facility] November 2017 with unstable angina. He underwent cardiac cath 10/03/16 and was found to have severe stenosis in the proximal RCA, Diagonal and CTO of the OM branch. Drug eluting stents were placed in the RCA and Diagonal branches. LVEF=45-50%. Echo 05/26/52 showed LV systolic was normal 29-92%. He had BP on the lower side when starting cardiac rehab so his Coreg and Cozaar doses were reduced.   He is here today for follow up. The patient denies any chest pain, dyspnea, palpitations, lower extremity edema, orthopnea, PND, dizziness, near syncope or syncope.   Primary Care Physician: Lavone Orn, MD  Past Medical History:  Diagnosis Date  . Asthma   . Asymptomatic LV dysfunction 10/04/2016  . Benign essential HTN   . CAD in native artery 10/04/2016  . Complication of anesthesia    " DIFFICULTY BREATHING ONE TIME "  . Coronary artery disease   . Diabetes type 2, controlled ([place]) 10/04/2016  . DM (diabetes mellitus), type 2 ([place])   . Erectile dysfunction   . Hypertriglyceridemia   . Psoriatic arthritis ([place])   . Research study patient 10/04/2016  . Rotator cuff disorder   . S/P angioplasty with stent, 10/03/16 DES to [place] to Diag 10/04/2016  . Vitamin B 12 deficiency     Past Surgical History:  Procedure Laterality Date  . APPENDECTOMY  1999  . CARDIAC CATHETERIZATION N/A 10/03/2016   Procedure: Left Heart Cath and Coronary Angiography;  Surgeon: Burnell Blanks, MD;  Location: [facility];  Service: Cardiovascular;  Laterality: N/A;  . CARDIAC CATHETERIZATION N/A 10/03/2016   Procedure: Coronary Stent Intervention;  Surgeon: Burnell Blanks, MD;  Location: [facility];  Service: Cardiovascular;  Laterality: N/A;  . colon polyps    .  CORONARY STENT PLACEMENT  10/03/2016   Severe stenosis proximal moderate caliber co-dominant RCA now s/p successful PTCA/DES x 1 proximal RCA.   Marland Kitchen KNEE SURGERY Right 1975   ligament and spurs  . KNEE SURGERY Left 1980   ligaments and spurs  . TOTAL KNEE ARTHROPLASTY  1990's    Current Outpatient Medications  Medication Sig Dispense Refill  . acetaminophen (TYLENOL) 325 MG tablet Take 2 tablets (650 mg total) by mouth every 4 (four) hours as needed for headache or mild pain.    Marland Kitchen aspirin EC 81 MG tablet Take 1 tablet (81 mg total) by mouth daily. 90 tablet 3  . atorvastatin (LIPITOR) 80 MG tablet take 1 tablet by mouth once daily AT 6PM 30 tablet 6  . carvedilol (COREG) 6.25 MG tablet Take 6.25 mg by mouth daily.     . clopidogrel (PLAVIX) 75 MG tablet Take 1 tablet (75 mg total) by mouth daily. 90 tablet 3  . Cyanocobalamin (B-12) 1000 MCG/ML KIT Inject 1,000 mcg as directed. EVERY 31 DAYS    . DULoxetine (CYMBALTA) 60 MG capsule Take 60 mg by mouth daily.    . halobetasol (ULTRAVATE) 0.05 % cream Apply 1 application topically 2 (two) times daily as needed (for psoriasis).     . insulin glargine (LANTUS) 100 UNIT/ML injection Inject 80 Units into the skin daily.    Marland Kitchen losartan (COZAAR) 50 MG tablet Take 50 mg by mouth every  evening.   1  . LYRICA 150 MG capsule Take 150 mg by mouth 2 (two) times daily.  0  . Menthol, Topical Analgesic, (ICY HOT BACK EX) Apply 1 application topically daily as needed (BACK PAIN).    Marland Kitchen metFORMIN (GLUCOPHAGE-XR) 500 MG 24 hr tablet Take 2 tablets (1,000 mg total) by mouth 2 (two) times daily.  1  . nitroGLYCERIN (NITROSTAT) 0.4 MG SL tablet Place 1 tablet (0.4 mg total) under the tongue every 5 (five) minutes as needed for chest pain. 25 tablet 12  . Secukinumab (COSENTYX SENSOREADY PEN [place]) Inject into the skin.    . Semaglutide,0.25 or 0.5MG/DOS, (OZEMPIC, 0.25 OR 0.5 MG/DOSE,) 2 MG/1.5ML SOPN Inject 0.25 mLs into the skin once a week.    . tamsulosin (FLOMAX)  0.4 MG CAPS capsule Take 0.4 mg by mouth every morning.    . traMADol (ULTRAM) 50 MG tablet Take 50 mg by mouth every 6 (six) hours as needed (for pain.).     No current facility-administered medications for this visit.     Allergies  Allergen Reactions  . Gabapentin Other (See Comments)    Other reaction(s): sleepy sleepy  . Indomethacin     Other reaction(s): h/a's headache  . Penicillins Rash    Has patient had a PCN reaction causing immediate rash, facial/tongue/throat swelling, SOB or lightheadedness with hypotension:yes Has patient had a PCN reaction causing severe rash involving mucus membranes or skin necrosis:No Has patient had a PCN reaction that required hospitalization:No Has patient had a PCN reaction occurring within the last 10 years:NO If all of the above answers are "NO", then may proceed with Cephalosporin use.     Social History   Socioeconomic History  . Marital status: Married    Spouse name: Not on file  . Number of children: Not on file  . Years of education: Not on file  . Highest education level: Not on file  Occupational History  . Not on file  Social Needs  . Financial resource strain: Not on file  . Food insecurity:    Worry: Not on file    Inability: Not on file  . Transportation needs:    Medical: Not on file    Non-medical: Not on file  Tobacco Use  . Smoking status: Never Smoker  . Smokeless tobacco: Never Used  Substance and Sexual Activity  . Alcohol use: Yes    Comment: rarely  . Drug use: No  . Sexual activity: Not on file  Lifestyle  . Physical activity:    Days per week: Not on file    Minutes per session: Not on file  . Stress: Not on file  Relationships  . Social connections:    Talks on phone: Not on file    Gets together: Not on file    Attends religious service: Not on file    Active member of club or organization: Not on file    Attends meetings of clubs or organizations: Not on file    Relationship status: Not  on file  . Intimate partner violence:    Fear of current or ex partner: Not on file    Emotionally abused: Not on file    Physically abused: Not on file    Forced sexual activity: Not on file  Other Topics Concern  . Not on file  Social History Narrative  . Not on file    Family History  Problem Relation Age of Onset  . Heart disease Mother   .  Hypertension Mother   . Breast cancer Mother   . Glaucoma Mother   . CAD Mother   . CVA Father   . Hypertension Father   . Neuropathy Father   . Other Father        carotid endarterectomy  . Hypertension Brother   . Diabetes Brother   . Arthritis Maternal Grandfather   . Healthy Son   . Healthy Son     Review of Systems:  As stated in the HPI and otherwise negative.   BP 130/84   Pulse 71   Ht 6' 1"  (1.854 m)   Wt 252 lb 6.4 oz (114.5 kg)   SpO2 98%   BMI 33.30 kg/m   Physical Examination: General: Well developed, well nourished, NAD  HEENT: OP clear, mucus membranes moist  SKIN: warm, dry. No rashes. Neuro: No focal deficits  Musculoskeletal: Muscle strength 5/5 all ext  Psychiatric: Mood and affect normal  Neck: No JVD, no carotid bruits, no thyromegaly, no lymphadenopathy.  Lungs:Clear bilaterally, no wheezes, rhonci, crackles Cardiovascular: Regular rate and rhythm. No murmurs, gallops or rubs. Abdomen:Soft. Bowel sounds present. Non-tender.  Extremities: No lower extremity edema. Pulses are 2 + in the bilateral DP/PT.  Echo 12/26/16: Left ventricle: The cavity size was normal. Wall thickness was   increased in a pattern of mild LVH. Systolic function was normal.   The estimated ejection fraction was in the range of 55% to 60%.   Wall motion was normal; there were no regional wall motion   abnormalities. Left ventricular diastolic function parameters   were normal. - Left atrium: The atrium was mildly dilated. - Atrial septum: No defect or patent foramen ovale was identified.  EKG:  EKG is ordered today. The  ekg ordered today demonstrates NSR, rate 71 bpm  Recent Labs: 11/19/2017: ALT 20 11/23/2017: BUN 13; Creatinine, Ser 0.95; Hemoglobin 10.1; Platelets 323; Potassium 3.8; Sodium 140   Lipid Panel    Component Value Date/Time   CHOL 105 02/02/2017 0749   TRIG 129 02/02/2017 0749   HDL 34 (L) 02/02/2017 0749   CHOLHDL 3.1 02/02/2017 0749   LDLCALC 45 02/02/2017 0749     Wt Readings from Last 3 Encounters:  10/01/18 252 lb 6.4 oz (114.5 kg)  03/11/18 254 lb (115.2 kg)  12/02/17 250 lb (113.4 kg)     Other studies Reviewed: Additional studies/ records that were reviewed today include: . Review of the above records demonstrates:   Assessment and Plan:   1. CAD without angina: He had PCI in November 2017 with drug eluting stents placed in the RCA and Diagonal. He has a CTO of the OM branch. He has no chest pain. LDL 39 in primary care October 2019. Continue ASA, statin and beta blocker. Will stop Plavix.     2. HTN: BP is well controlled. No changes today  The patient does not have concerns regarding medicines.  The following changes have been made:  no change  Labs/ tests ordered today include:   No orders of the defined types were placed in this encounter.   Disposition:   FU with me in 12 months  Signed, Lauree Chandler, MD 10/01/2018 9:11 AM    [address], [place], [place]  [address] Phone: [phone]; Fax: [phone]

## 2018-10-01 ENCOUNTER — Encounter: Payer: Self-pay | Admitting: Cardiovascular Disease

## 2018-10-01 ENCOUNTER — Ambulatory Visit (INDEPENDENT_AMBULATORY_CARE_PROVIDER_SITE_OTHER): Payer: BLUE CROSS/BLUE SHIELD | Admitting: Cardiovascular Disease

## 2018-10-01 VITALS — BP 130/84 | HR 71 | Ht 73.0 in | Wt 252.4 lb

## 2018-10-01 DIAGNOSIS — I10 Essential (primary) hypertension: Secondary | ICD-10-CM | POA: Diagnosis not present

## 2018-10-01 DIAGNOSIS — I2510 Atherosclerotic heart disease of native coronary artery without angina pectoris: Secondary | ICD-10-CM | POA: Diagnosis not present

## 2018-10-01 NOTE — Patient Instructions (Signed)
Medication Instructions:  Your physician has recommended you make the following change in your medication: Stop clopidogrel  If you need a refill on your cardiac medications before your next appointment, please call your pharmacy.   Lab work: none If you have labs (blood work) drawn today and your tests are completely normal, you will receive your results only by: Marland Kitchen MyChart Message (if you have MyChart) OR . A paper copy in the mail If you have any lab test that is abnormal or we need to change your treatment, we will call you to review the results.  Testing/Procedures: none  Follow-Up: At [facility], you and your health needs are our priority.  As part of our continuing mission to provide you with exceptional heart care, we have created designated Provider Care Teams.  These Care Teams include your primary Cardiologist (physician) and Advanced Practice Providers (APPs -  Physician Assistants and Nurse Practitioners) who all work together to provide you with the care you need, when you need it. You will need a follow up appointment in 12 months.  Please call our office 2 months in advance to schedule this appointment.  You may see Lauree Chandler, MD or one of the following Advanced Practice Providers on your designated Care Team:   [place], PA-C Melina Copa, PA-C . Ermalinda Barrios, PA-C  Any Other Special Instructions Will Be Listed Below (If Applicable).

## 2018-10-27 DIAGNOSIS — G4733 Obstructive sleep apnea (adult) (pediatric): Secondary | ICD-10-CM | POA: Diagnosis not present

## 2018-10-29 DIAGNOSIS — E539 Vitamin B deficiency, unspecified: Secondary | ICD-10-CM | POA: Diagnosis not present

## 2019-01-07 DIAGNOSIS — Z794 Long term (current) use of insulin: Secondary | ICD-10-CM | POA: Diagnosis not present

## 2019-01-07 DIAGNOSIS — I10 Essential (primary) hypertension: Secondary | ICD-10-CM | POA: Diagnosis not present

## 2019-01-07 DIAGNOSIS — E1142 Type 2 diabetes mellitus with diabetic polyneuropathy: Secondary | ICD-10-CM | POA: Diagnosis not present

## 2019-01-10 DIAGNOSIS — E538 Deficiency of other specified B group vitamins: Secondary | ICD-10-CM | POA: Diagnosis not present

## 2019-03-07 ENCOUNTER — Other Ambulatory Visit: Payer: Self-pay | Admitting: Cardiovascular Disease

## 2019-09-06 ENCOUNTER — Other Ambulatory Visit: Payer: Self-pay | Admitting: Cardiovascular Disease

## 2019-09-06 ENCOUNTER — Ambulatory Visit
Admission: RE | Admit: 2019-09-06 | Discharge: 2019-09-06 | Disposition: A | Discharge status: Home / Self Care | Payer: BLUE CROSS/BLUE SHIELD | Source: Ambulatory Visit | Attending: Internal Medicine | Admitting: Internal Medicine

## 2019-09-06 ENCOUNTER — Other Ambulatory Visit: Payer: Self-pay | Admitting: Internal Medicine

## 2019-09-06 DIAGNOSIS — R0602 Shortness of breath: Secondary | ICD-10-CM

## 2019-09-06 MED ORDER — ATORVASTATIN CALCIUM 80 MG PO TABS: ORAL_TABLET | ORAL | 0 refills | Status: DC

## 2019-09-19 ENCOUNTER — Other Ambulatory Visit: Payer: Self-pay

## 2019-09-19 ENCOUNTER — Encounter: Payer: Self-pay | Admitting: Cardiovascular Disease

## 2019-09-19 ENCOUNTER — Ambulatory Visit: Payer: 59 | Admitting: Cardiovascular Disease

## 2019-09-19 VITALS — BP 132/78 | HR 81 | Ht 74.0 in | Wt 256.8 lb

## 2019-09-19 DIAGNOSIS — I10 Essential (primary) hypertension: Secondary | ICD-10-CM

## 2019-09-19 DIAGNOSIS — I2510 Atherosclerotic heart disease of native coronary artery without angina pectoris: Secondary | ICD-10-CM

## 2019-09-19 NOTE — Patient Instructions (Signed)

## 2019-09-19 NOTE — Progress Notes (Signed)
Chief Complaint  Patient presents with  . Follow-up    CAD     History of Present Illness: 63 yo male with history of DM, HTN, HLD and CAD who is here today for cardiac follow up. He was admitted to [facility] November 2017 with unstable angina. He underwent cardiac cath 10/03/16 and was found to have severe stenosis in the proximal RCA, Diagonal and CTO of the OM branch. Drug eluting stents were placed in the RCA and Diagonal branches. LVEF=45-50%. Echo 07/29/08 showed LV systolic was normal with TOIZ=12-45%.   He is here today for follow up. The patient denies any dyspnea, palpitations, lower extremity edema, orthopnea, PND, dizziness, near syncope or syncope. He has had constant mild chest pressure for months, lasts all day/night. Improved with treatment for his asthma. No exertional chest pain.   Primary Care Physician: Lavone Orn, MD  Past Medical History:  Diagnosis Date  . Asthma   . Asymptomatic LV dysfunction 10/04/2016  . Benign essential HTN   . CAD in native artery 10/04/2016  . Complication of anesthesia    " DIFFICULTY BREATHING ONE TIME "  . Coronary artery disease   . Diabetes type 2, controlled ([place]) 10/04/2016  . DM (diabetes mellitus), type 2 ([place])   . Erectile dysfunction   . Hypertriglyceridemia   . Psoriatic arthritis ([place])   . Research study patient 10/04/2016  . Rotator cuff disorder   . S/P angioplasty with stent, 10/03/16 DES to [place] to Diag 10/04/2016  . Vitamin B 12 deficiency     Past Surgical History:  Procedure Laterality Date  . APPENDECTOMY  1999  . CARDIAC CATHETERIZATION N/A 10/03/2016   Procedure: Left Heart Cath and Coronary Angiography;  Surgeon: Burnell Blanks, MD;  Location: [facility];  Service: Cardiovascular;  Laterality: N/A;  . CARDIAC CATHETERIZATION N/A 10/03/2016   Procedure: Coronary Stent Intervention;  Surgeon: Burnell Blanks, MD;  Location: [facility];  Service: Cardiovascular;   Laterality: N/A;  . colon polyps    . CORONARY STENT PLACEMENT  10/03/2016   Severe stenosis proximal moderate caliber co-dominant RCA now s/p successful PTCA/DES x 1 proximal RCA.   Marland Kitchen KNEE SURGERY Right 1975   ligament and spurs  . KNEE SURGERY Left 1980   ligaments and spurs  . TOTAL KNEE ARTHROPLASTY  1990's    Current Outpatient Medications  Medication Sig Dispense Refill  . acetaminophen (TYLENOL) 325 MG tablet Take 2 tablets (650 mg total) by mouth every 4 (four) hours as needed for headache or mild pain.    Marland Kitchen aspirin EC 81 MG tablet Take 1 tablet (81 mg total) by mouth daily. 90 tablet 3  . atorvastatin (LIPITOR) 80 MG tablet TAKE 1 TABLET BY MOUTH EVERY DAY AT 6 PM. Please keep upcoming appt in October with Dr. Angelena Form before anymore refills. Thank you 90 tablet 0  . carvedilol (COREG) 6.25 MG tablet Take 6.25 mg by mouth daily.     . Cyanocobalamin (B-12) 1000 MCG/ML KIT Inject 1,000 mcg as directed. EVERY 31 DAYS    . DULoxetine (CYMBALTA) 60 MG capsule Take 60 mg by mouth daily.    . halobetasol (ULTRAVATE) 0.05 % cream Apply 1 application topically 2 (two) times daily as needed (for psoriasis).     . insulin glargine (LANTUS) 100 UNIT/ML injection Inject 80 Units into the skin daily.    Marland Kitchen losartan (COZAAR) 50 MG tablet Take 50 mg by mouth every evening.  1  . LYRICA 150 MG capsule Take 150 mg by mouth 2 (two) times daily.  0  . Menthol, Topical Analgesic, (ICY HOT BACK EX) Apply 1 application topically daily as needed (BACK PAIN).    Marland Kitchen metFORMIN (GLUCOPHAGE-XR) 500 MG 24 hr tablet Take 2 tablets (1,000 mg total) by mouth 2 (two) times daily.  1  . nitroGLYCERIN (NITROSTAT) 0.4 MG SL tablet Place 1 tablet (0.4 mg total) under the tongue every 5 (five) minutes as needed for chest pain. 25 tablet 12  . Semaglutide,0.25 or 0.5MG/DOS, (OZEMPIC, 0.25 OR 0.5 MG/DOSE,) 2 MG/1.5ML SOPN Inject 0.25 mLs into the skin once a week.    . tamsulosin (FLOMAX) 0.4 MG CAPS capsule Take 0.4 mg  by mouth every morning.    . traMADol (ULTRAM) 50 MG tablet Take 50 mg by mouth every 6 (six) hours as needed (for pain.).     No current facility-administered medications for this visit.     Allergies  Allergen Reactions  . Gabapentin Other (See Comments)    Other reaction(s): sleepy sleepy  . Indomethacin Other (See Comments)    Other reaction(s): h/a's headache headache  . Penicillins Rash    Has patient had a PCN reaction causing immediate rash, facial/tongue/throat swelling, SOB or lightheadedness with hypotension:yes Has patient had a PCN reaction causing severe rash involving mucus membranes or skin necrosis:No Has patient had a PCN reaction that required hospitalization:No Has patient had a PCN reaction occurring within the last 10 years:NO If all of the above answers are "NO", then may proceed with Cephalosporin use.     Social History   Socioeconomic History  . Marital status: Married    Spouse name: Not on file  . Number of children: Not on file  . Years of education: Not on file  . Highest education level: Not on file  Occupational History  . Not on file  Social Needs  . Financial resource strain: Not on file  . Food insecurity    Worry: Not on file    Inability: Not on file  . Transportation needs    Medical: Not on file    Non-medical: Not on file  Tobacco Use  . Smoking status: Never Smoker  . Smokeless tobacco: Never Used  Substance and Sexual Activity  . Alcohol use: Yes    Comment: rarely  . Drug use: No  . Sexual activity: Not on file  Lifestyle  . Physical activity    Days per week: Not on file    Minutes per session: Not on file  . Stress: Not on file  Relationships  . Social Herbalist on phone: Not on file    Gets together: Not on file    Attends religious service: Not on file    Active member of club or organization: Not on file    Attends meetings of clubs or organizations: Not on file    Relationship status: Not on file   . Intimate partner violence    Fear of current or ex partner: Not on file    Emotionally abused: Not on file    Physically abused: Not on file    Forced sexual activity: Not on file  Other Topics Concern  . Not on file  Social History Narrative  . Not on file    Family History  Problem Relation Age of Onset  . Heart disease Mother   . Hypertension Mother   . Breast cancer Mother   .  Glaucoma Mother   . CAD Mother   . CVA Father   . Hypertension Father   . Neuropathy Father   . Other Father        carotid endarterectomy  . Hypertension Brother   . Diabetes Brother   . Arthritis Maternal Grandfather   . Healthy Son   . Healthy Son     Review of Systems:  As stated in the HPI and otherwise negative.   BP 132/78   Pulse 81   Ht 6' 2" (1.88 m)   Wt 256 lb 12.8 oz (116.5 kg)   BMI 32.97 kg/m   Physical Examination:  General: Well developed, well nourished, NAD  HEENT: OP clear, mucus membranes moist  SKIN: warm, dry. No rashes. Neuro: No focal deficits  Musculoskeletal: Muscle strength 5/5 all ext  Psychiatric: Mood and affect normal  Neck: No JVD, no carotid bruits, no thyromegaly, no lymphadenopathy.  Lungs:Clear bilaterally, no wheezes, rhonci, crackles Cardiovascular: Regular rate and rhythm. No murmurs, gallops or rubs. Abdomen:Soft. Bowel sounds present. Non-tender.  Extremities: No lower extremity edema. Pulses are 2 + in the bilateral DP/PT.  Echo 12/26/16: Left ventricle: The cavity size was normal. Wall thickness was   increased in a pattern of mild LVH. Systolic function was normal.   The estimated ejection fraction was in the range of 55% to 60%.   Wall motion was normal; there were no regional wall motion   abnormalities. Left ventricular diastolic function parameters   were normal. - Left atrium: The atrium was mildly dilated. - Atrial septum: No defect or patent foramen ovale was identified.  EKG:  EKG is ordered today. The ekg ordered today  demonstrates Sinus, rate 81 bpm. PACs.   Recent Labs: No results found for requested labs within last 8760 hours.   Lipid Panel    Component Value Date/Time   CHOL 105 02/02/2017 0749   TRIG 129 02/02/2017 0749   HDL 34 (L) 02/02/2017 0749   CHOLHDL 3.1 02/02/2017 0749   LDLCALC 45 02/02/2017 0749     Wt Readings from Last 3 Encounters:  09/19/19 256 lb 12.8 oz (116.5 kg)  10/01/18 252 lb 6.4 oz (114.5 kg)  03/11/18 254 lb (115.2 kg)     Other studies Reviewed: Additional studies/ records that were reviewed today include: . Review of the above records demonstrates:   Assessment and Plan:   1. CAD without angina: He had PCI in November 2017 with drug eluting stents placed in the RCA and Diagonal. He has a CTO of the OM branch. He has had ongoing mild chest pressure all day/night for months. Improved with treatment for his asthma. No exertional chest pain. Will continue ASA, statin and beta blocker.      2. HTN: BP is controlled. No changes.   The patient does not have concerns regarding medicines.  The following changes have been made:  no change  Labs/ tests ordered today include:   Orders Placed This Encounter  Procedures  . EKG 12-Lead    Disposition:   FU with me in 12 months  Signed, Lauree Chandler, MD 09/19/2019 3:16 PM    [facility] [address], [place], [place]  [address] Phone: [phone]; Fax: [phone]

## 2019-12-05 ENCOUNTER — Other Ambulatory Visit: Payer: Self-pay | Admitting: Cardiovascular Disease

## 2019-12-05 MED ORDER — ATORVASTATIN CALCIUM 80 MG PO TABS: ORAL_TABLET | ORAL | 2 refills | Status: DC

## 2020-01-26 ENCOUNTER — Ambulatory Visit: Payer: 59 | Attending: Internal Medicine

## 2020-01-26 DIAGNOSIS — Z23 Encounter for immunization: Secondary | ICD-10-CM

## 2020-01-26 NOTE — Progress Notes (Signed)
   Covid-19 Vaccination Clinic  Name:  Jermaine Jennings    MRN: [id number] DOB: [birth year]  01/26/2020  Jermaine Jennings was observed post Covid-19 immunization for 15 minutes without incident. He was provided with Vaccine Information Sheet and instruction to access the V-Safe system.   Jermaine Jennings was instructed to call 911 with any severe reactions post vaccine: Marland Kitchen Difficulty breathing  . Swelling of face and throat  . A fast heartbeat  . A bad rash all over body  . Dizziness and weakness   Immunizations Administered    Name Date Dose VIS Date Route   Pfizer COVID-19 Vaccine 01/26/2020  5:48 PM 0.3 mL 11/04/2019 Intramuscular   Manufacturer: [place]   Lot: [id number]   [place]: [id number]

## 2020-02-22 ENCOUNTER — Ambulatory Visit: Payer: 59 | Attending: Internal Medicine

## 2020-02-22 DIAGNOSIS — Z23 Encounter for immunization: Secondary | ICD-10-CM

## 2020-02-22 NOTE — Progress Notes (Signed)
   Covid-19 Vaccination Clinic  Name:  Jermaine Jennings    MRN: [id number] DOB: [birth year]  02/22/2020  Mr. Jermaine Jennings was observed post Covid-19 immunization for 15 minutes without incident. He was provided with Vaccine Information Sheet and instruction to access the V-Safe system.   Mr. Jermaine Jennings was instructed to call 911 with any severe reactions post vaccine: Marland Kitchen Difficulty breathing  . Swelling of face and throat  . A fast heartbeat  . A bad rash all over body  . Dizziness and weakness   Immunizations Administered    Name Date Dose VIS Date Route   Pfizer COVID-19 Vaccine 02/22/2020  3:59 PM 0.3 mL 11/04/2019 Intramuscular   Manufacturer: [employer]   Lot: [id number]   [place]: [id number]

## 2020-06-12 IMAGING — CR DG CHEST 2V
2 series · 2 of 2 positions shown · non-contrast
Comparison: Chest x-ray dated December 03, 2011.

CLINICAL DATA: Shortness of breath for the past 5 months.

EXAM:
CHEST - 2 VIEW

[w chest pa]
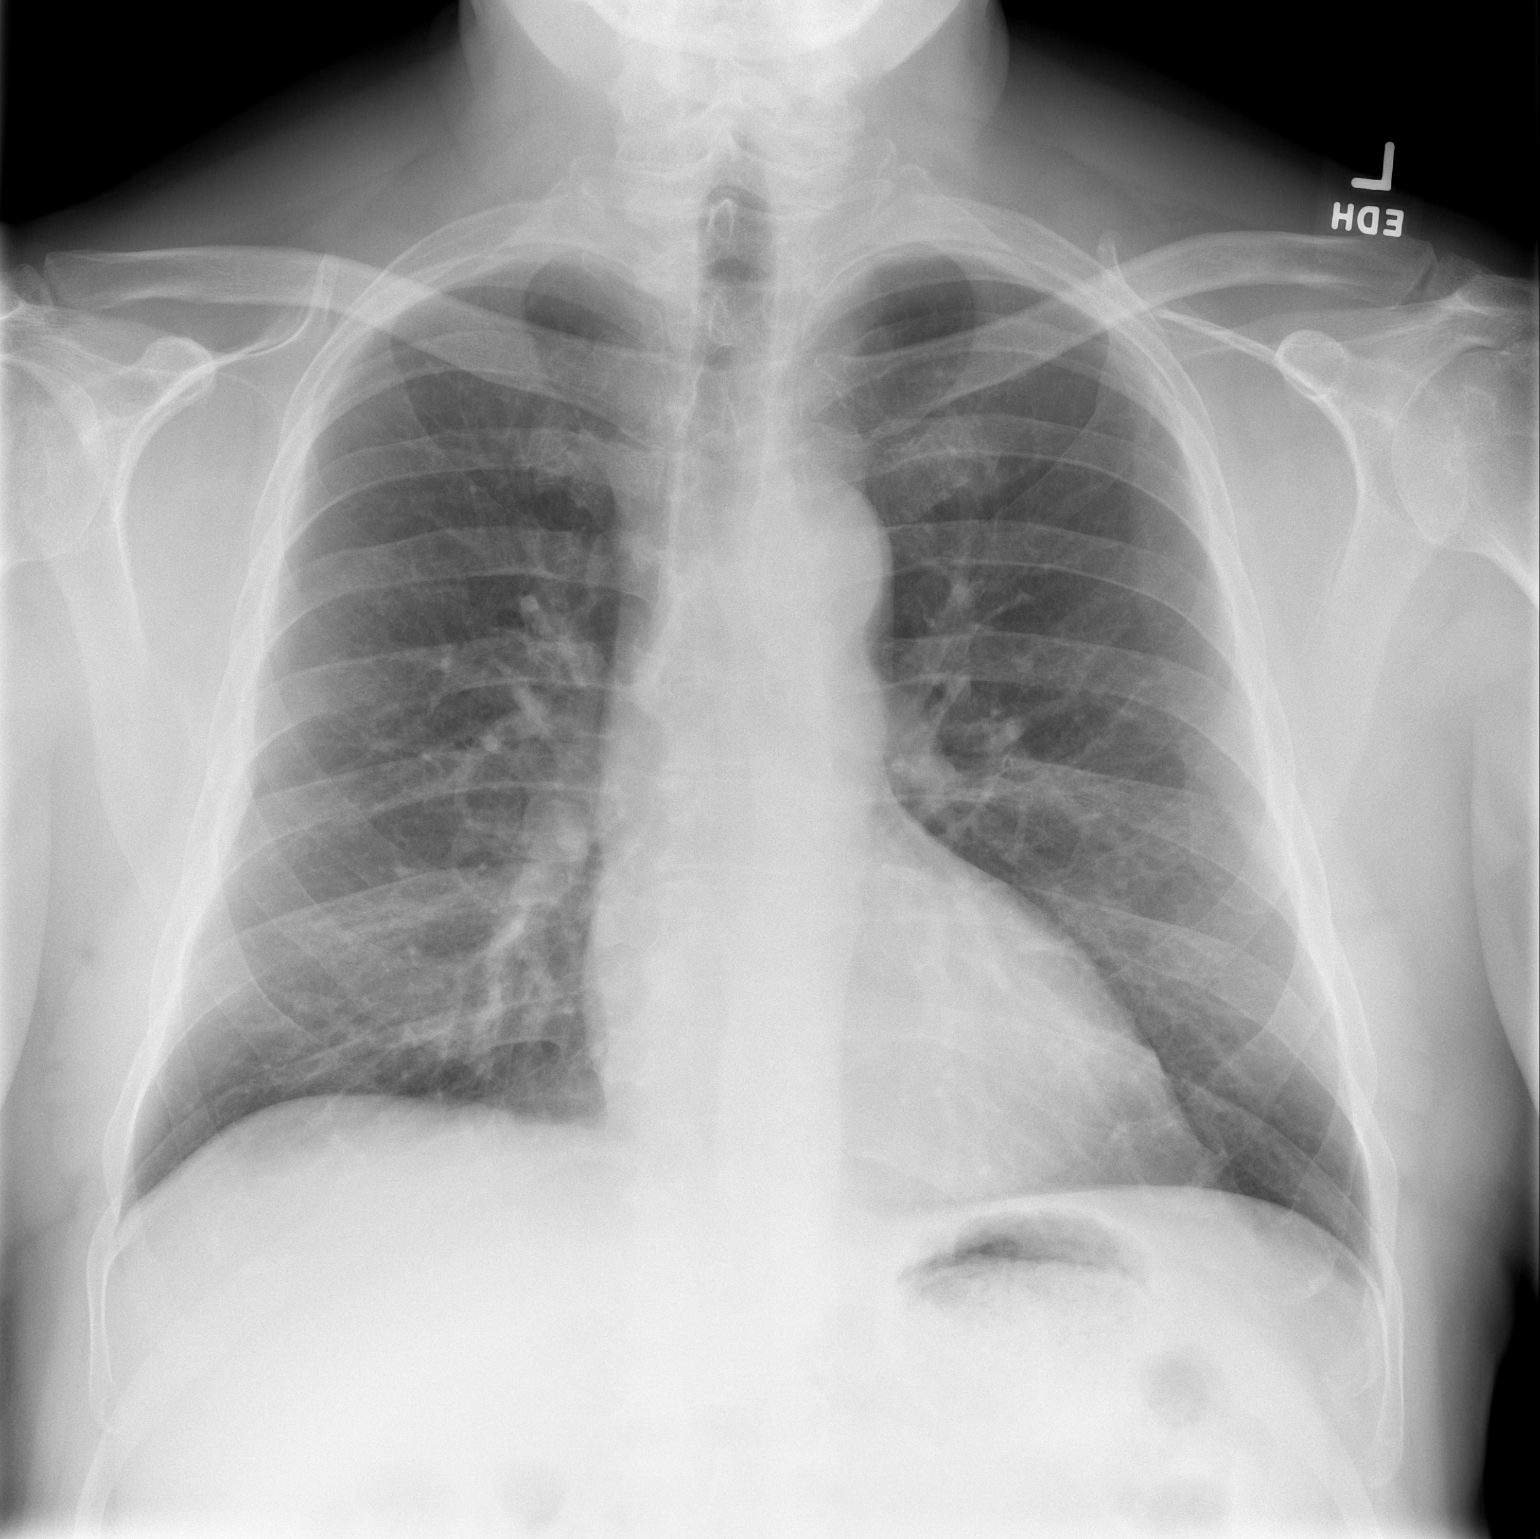

[w chest lat]
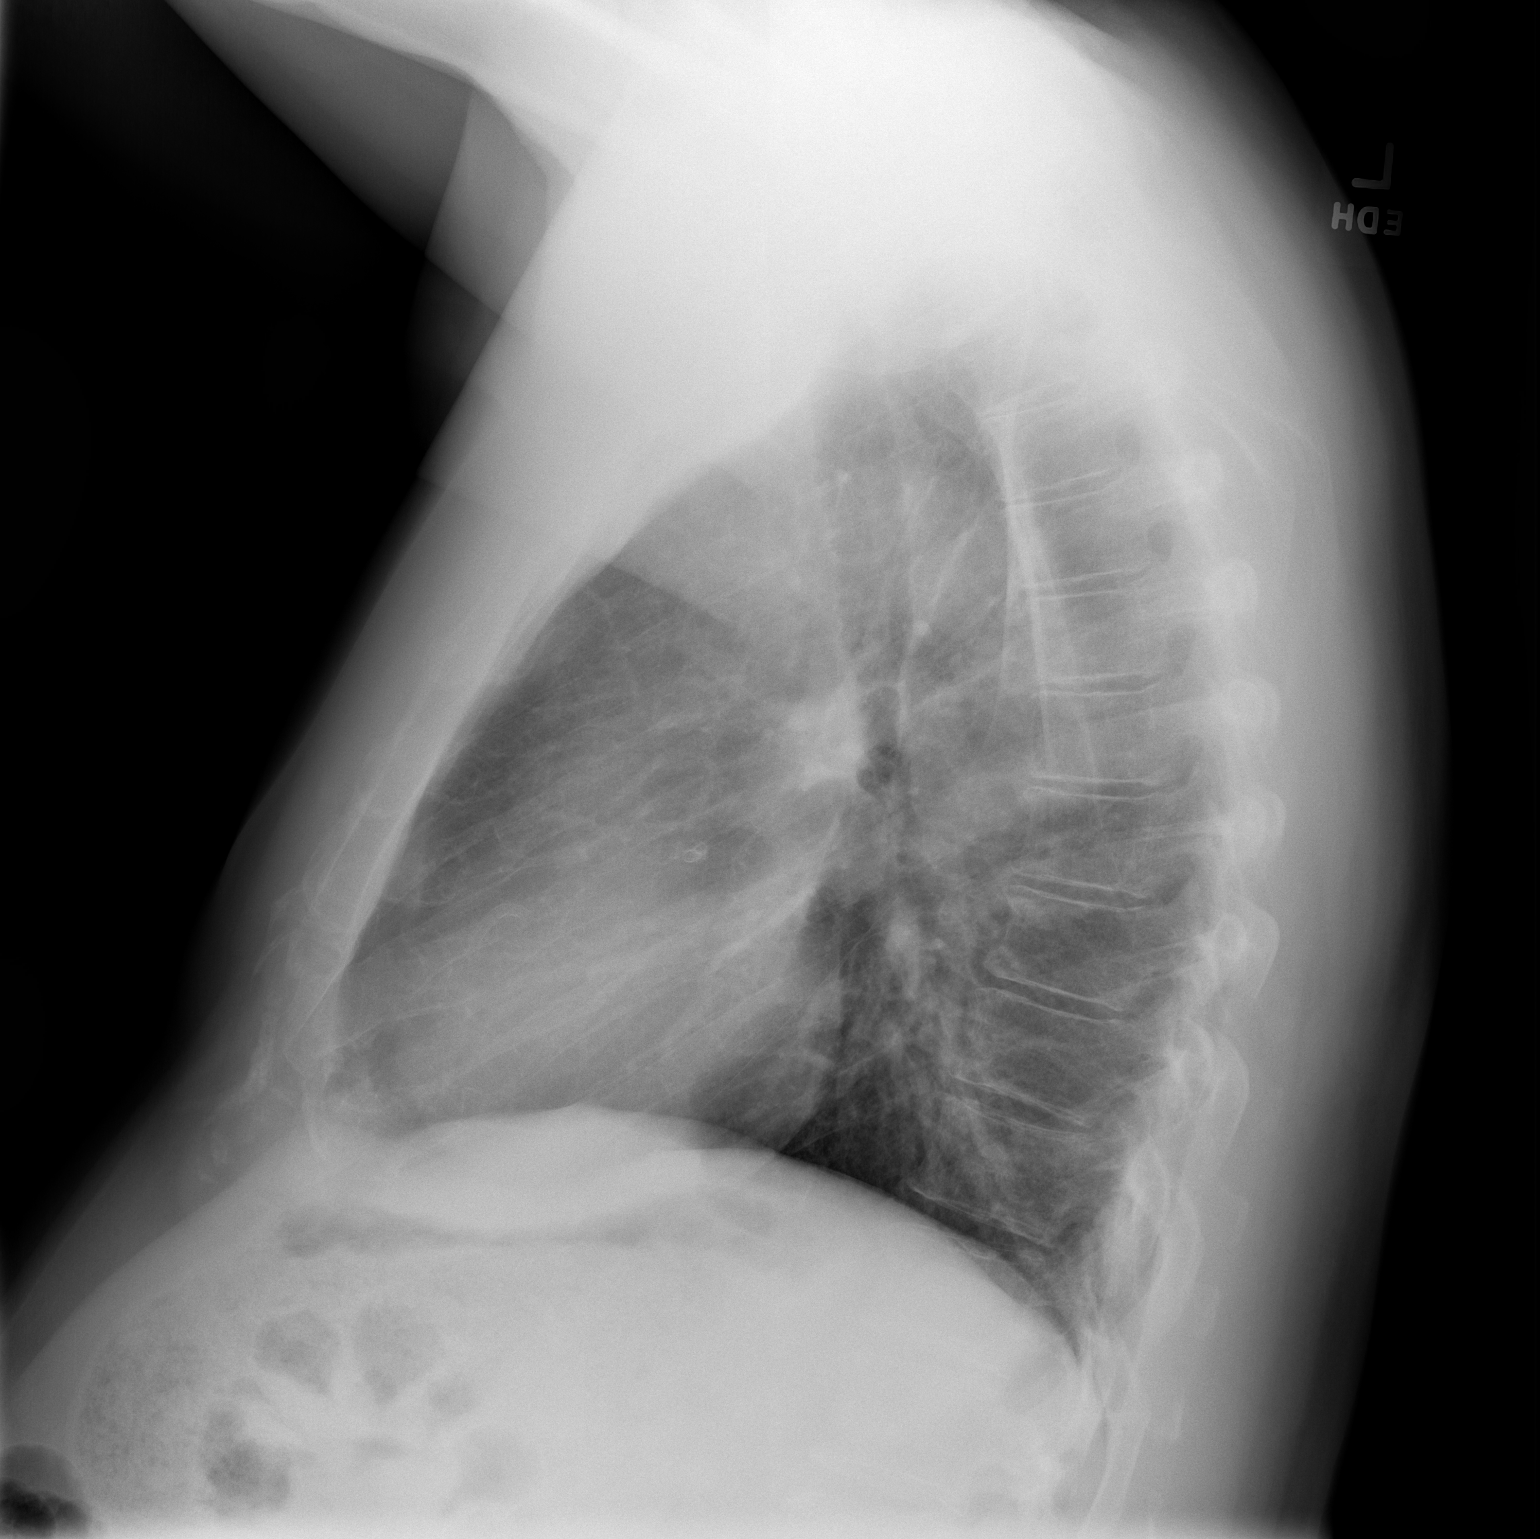

[2 of 2 positions shown; findings below may reference images not displayed]

FINDINGS: The heart size and mediastinal contours are within normal limits.
Normal pulmonary vascularity. No focal consolidation, pleural
effusion, or pneumothorax. No acute osseous abnormality.
IMPRESSION: No active cardiopulmonary disease.

## 2020-09-24 ENCOUNTER — Encounter: Payer: Self-pay | Admitting: Cardiovascular Disease

## 2020-09-24 ENCOUNTER — Other Ambulatory Visit: Payer: Self-pay

## 2020-09-24 ENCOUNTER — Ambulatory Visit: Payer: 59 | Admitting: Cardiovascular Disease

## 2020-09-24 VITALS — BP 128/70 | HR 89 | Ht 74.0 in | Wt 250.2 lb

## 2020-09-24 DIAGNOSIS — I10 Essential (primary) hypertension: Secondary | ICD-10-CM

## 2020-09-24 DIAGNOSIS — I2510 Atherosclerotic heart disease of native coronary artery without angina pectoris: Secondary | ICD-10-CM | POA: Diagnosis not present

## 2020-09-24 NOTE — Progress Notes (Signed)
Chief Complaint  Patient presents with   Follow-up    CAD    History of Present Illness: 64 yo male with history of DM, HTN, HLD and CAD who is here today for cardiac follow up. He was admitted to [facility] November 2017 with unstable angina. He underwent cardiac cath 10/03/16 and was found to have severe stenosis in the proximal RCA, Diagonal and CTO of the OM branch. Drug eluting stents were placed in the RCA and Diagonal branches. LVEF=45-50%. Echo 0/9/23 showed LV systolic was normal with RAQT=62-26%.   He is here today for follow up. The patient denies any chest pain, dyspnea, palpitations, lower extremity edema, orthopnea, PND, dizziness, near syncope or syncope.   Primary Care Physician: Lavone Orn, MD  Past Medical History:  Diagnosis Date   Asthma    Asymptomatic LV dysfunction 10/04/2016   Benign essential HTN    CAD in native artery 33/35/4562   Complication of anesthesia    " DIFFICULTY BREATHING ONE TIME "   Coronary artery disease    Diabetes type 2, controlled ([place]) 10/04/2016   DM (diabetes mellitus), type 2 ([place])    Erectile dysfunction    Hypertriglyceridemia    Psoriatic arthritis ([place])    Research study patient 10/04/2016   Rotator cuff disorder    S/P angioplasty with stent, 10/03/16 DES to pRCA & DES to Diag 10/04/2016   Vitamin B 12 deficiency     Past Surgical History:  Procedure Laterality Date   APPENDECTOMY  1999   CARDIAC CATHETERIZATION N/A 10/03/2016   Procedure: Left Heart Cath and Coronary Angiography;  Surgeon: Burnell Blanks, MD;  Location: [facility];  Service: Cardiovascular;  Laterality: N/A;   CARDIAC CATHETERIZATION N/A 10/03/2016   Procedure: Coronary Stent Intervention;  Surgeon: Burnell Blanks, MD;  Location: [place] [facility];  Service: Cardiovascular;  Laterality: N/A;   colon polyps     CORONARY STENT PLACEMENT  10/03/2016   Severe stenosis proximal moderate caliber co-dominant RCA  now s/p successful PTCA/DES x 1 proximal RCA.    KNEE SURGERY Right 1975   ligament and spurs   KNEE SURGERY Left 1980   ligaments and spurs   TOTAL KNEE ARTHROPLASTY  1990's    Current Outpatient Medications  Medication Sig Dispense Refill   acetaminophen (TYLENOL) 325 MG tablet Take 2 tablets (650 mg total) by mouth every 4 (four) hours as needed for headache or mild pain.     aspirin EC 81 MG tablet Take 1 tablet (81 mg total) by mouth daily. 90 tablet 3   atorvastatin (LIPITOR) 80 MG tablet TAKE 1 TABLET BY MOUTH EVERY DAY AT 6 PM. 90 tablet 2   carvedilol (COREG) 6.25 MG tablet Take 6.25 mg by mouth in the morning and at bedtime.      DULoxetine (CYMBALTA) 60 MG capsule Take 60 mg by mouth daily.     halobetasol (ULTRAVATE) 0.05 % cream Apply 1 application topically 2 (two) times daily as needed (for psoriasis).      insulin glargine (LANTUS) 100 UNIT/ML injection Inject 80 Units into the skin daily. 45 units in the morning and 45 units in the pm     losartan (COZAAR) 50 MG tablet Take 50 mg by mouth every evening.   1   LYRICA 150 MG capsule Take 150 mg by mouth 2 (two) times daily.  0   Menthol, Topical Analgesic, (ICY HOT BACK EX) Apply 1 application topically daily as needed (BACK  PAIN).     metFORMIN (GLUCOPHAGE-XR) 500 MG 24 hr tablet Take 2 tablets (1,000 mg total) by mouth 2 (two) times daily.  1   nitroGLYCERIN (NITROSTAT) 0.4 MG SL tablet Place 1 tablet (0.4 mg total) under the tongue every 5 (five) minutes as needed for chest pain. 25 tablet 12   Semaglutide,0.25 or 0.5MG /DOS, (OZEMPIC, 0.25 OR 0.5 MG/DOSE,) 2 MG/1.5ML SOPN Inject 0.25 mLs into the skin once a week.     tamsulosin (FLOMAX) 0.4 MG CAPS capsule Take 0.4 mg by mouth every morning.     traMADol (ULTRAM) 50 MG tablet Take 50 mg by mouth every 6 (six) hours as needed (for pain.).     No current facility-administered medications for this visit.    Allergies  Allergen Reactions   Gabapentin  Other (See Comments)    Other reaction(s): sleepy sleepy   Indomethacin Other (See Comments)    Other reaction(s): h/a's headache headache   Penicillins Rash    Has patient had a PCN reaction causing immediate rash, facial/tongue/throat swelling, SOB or lightheadedness with hypotension:yes Has patient had a PCN reaction causing severe rash involving mucus membranes or skin necrosis:No Has patient had a PCN reaction that required hospitalization:No Has patient had a PCN reaction occurring within the last 10 years:NO If all of the above answers are "NO", then may proceed with Cephalosporin use.     Social History   Socioeconomic History   Marital status: Married    Spouse name: Not on file   Number of children: Not on file   Years of education: Not on file   Highest education level: Not on file  Occupational History   Not on file  Tobacco Use   Smoking status: Never Smoker   Smokeless tobacco: Never Used  Vaping Use   Vaping Use: Never used  Substance and Sexual Activity   Alcohol use: Yes    Comment: rarely   Drug use: No   Sexual activity: Not on file  Other Topics Concern   Not on file  Social History Narrative   Not on file   Social Determinants of Health   Financial Resource Strain:    Difficulty of Paying Living Expenses: Not on file  Food Insecurity:    Worried About [place] in the Last Year: Not on file   Ran Out of Food in the Last Year: Not on file  Transportation Needs:    Lack of Transportation (Medical): Not on file   Lack of Transportation (Non-Medical): Not on file  Physical Activity:    Days of Exercise per Week: Not on file   Minutes of Exercise per Session: Not on file  Stress:    Feeling of Stress : Not on file  Social Connections:    Frequency of Communication with Friends and Family: Not on file   Frequency of Social Gatherings with Friends and Family: Not on file   Attends Religious Services: Not on  file   Active Member of Clubs or Organizations: Not on file   Attends Archivist Meetings: Not on file   Marital Status: Not on file  Intimate Partner Violence:    Fear of Current or Ex-Partner: Not on file   Emotionally Abused: Not on file   Physically Abused: Not on file   Sexually Abused: Not on file    Family History  Problem Relation Age of Onset   Heart disease Mother    Hypertension Mother    Breast cancer Mother  Glaucoma Mother    CAD Mother    CVA Father    Hypertension Father    Neuropathy Father    Other Father        carotid endarterectomy   Hypertension Brother    Diabetes Brother    Arthritis Maternal Grandfather    Healthy Son    Healthy Son     Review of Systems:  As stated in the HPI and otherwise negative.   BP 128/70    Pulse 89    Ht 6\' 2"  (1.88 m)    Wt 250 lb 3.2 oz (113.5 kg)    SpO2 96%    BMI 32.12 kg/m   Physical Examination:   General: Well developed, well nourished, NAD  HEENT: OP clear, mucus membranes moist  SKIN: warm, dry. No rashes. Neuro: No focal deficits  Musculoskeletal: Muscle strength 5/5 all ext  Psychiatric: Mood and affect normal  Neck: No JVD, no carotid bruits, no thyromegaly, no lymphadenopathy.  Lungs:Clear bilaterally, no wheezes, rhonci, crackles Cardiovascular: Regular rate and rhythm. No murmurs, gallops or rubs. Abdomen:Soft. Bowel sounds present. Non-tender.  Extremities: No lower extremity edema. Pulses are 2 + in the bilateral DP/PT.  Echo 12/26/16: Left ventricle: The cavity size was normal. Wall thickness was   increased in a pattern of mild LVH. Systolic function was normal.   The estimated ejection fraction was in the range of 55% to 60%.   Wall motion was normal; there were no regional wall motion   abnormalities. Left ventricular diastolic function parameters   were normal. - Left atrium: The atrium was mildly dilated. - Atrial septum: No defect or patent foramen ovale  was identified.  EKG:  EKG is ordered today. The ekg ordered today demonstrates  sinus  Recent Labs: No results found for requested labs within last 8760 hours.   Lipid Panel    Component Value Date/Time   CHOL 105 02/02/2017 0749   TRIG 129 02/02/2017 0749   HDL 34 (L) 02/02/2017 0749   CHOLHDL 3.1 02/02/2017 0749   LDLCALC 45 02/02/2017 0749     Wt Readings from Last 3 Encounters:  09/24/20 250 lb 3.2 oz (113.5 kg)  09/19/19 256 lb 12.8 oz (116.5 kg)  10/01/18 252 lb 6.4 oz (114.5 kg)     Other studies Reviewed: Additional studies/ records that were reviewed today include: . Review of the above records demonstrates:   Assessment and Plan:   1. CAD without angina: He had PCI in November 2017 with drug eluting stents placed in the RCA and Diagonal. He has a CTO of the OM branch. Chronic mild chest pain. ? Asthma. No exertional chest pain. Continue ASA, statin and beta blocker.     2. HTN: BP is controlled. No changes  The patient does not have concerns regarding medicines.  The following changes have been made:  no change  Labs/ tests ordered today include:   Orders Placed This Encounter  Procedures   EKG 12-Lead    Disposition:   FU with me in 12 months  Signed, Lauree Chandler, MD 09/24/2020 3:56 PM    [facility] [address], [place], [place]  [address] Phone: [phone]; Fax: [phone]

## 2020-09-24 NOTE — Patient Instructions (Signed)

## 2020-11-02 ENCOUNTER — Other Ambulatory Visit: Payer: Self-pay | Admitting: Cardiovascular Disease

## 2021-07-01 DIAGNOSIS — E119 Type 2 diabetes mellitus without complications: Secondary | ICD-10-CM | POA: Diagnosis not present

## 2021-07-01 DIAGNOSIS — H01023 Squamous blepharitis right eye, unspecified eyelid: Secondary | ICD-10-CM | POA: Diagnosis not present

## 2021-07-01 DIAGNOSIS — H01026 Squamous blepharitis left eye, unspecified eyelid: Secondary | ICD-10-CM | POA: Diagnosis not present

## 2021-07-01 DIAGNOSIS — H02831 Dermatochalasis of right upper eyelid: Secondary | ICD-10-CM | POA: Diagnosis not present

## 2021-09-20 DIAGNOSIS — Z125 Encounter for screening for malignant neoplasm of prostate: Secondary | ICD-10-CM | POA: Diagnosis not present

## 2021-09-20 DIAGNOSIS — Z23 Encounter for immunization: Secondary | ICD-10-CM | POA: Diagnosis not present

## 2021-09-20 DIAGNOSIS — G608 Other hereditary and idiopathic neuropathies: Secondary | ICD-10-CM | POA: Diagnosis not present

## 2021-09-20 DIAGNOSIS — E1142 Type 2 diabetes mellitus with diabetic polyneuropathy: Secondary | ICD-10-CM | POA: Diagnosis not present

## 2021-09-20 DIAGNOSIS — Z0000 Encounter for general adult medical examination without abnormal findings: Secondary | ICD-10-CM | POA: Diagnosis not present

## 2021-09-20 DIAGNOSIS — I10 Essential (primary) hypertension: Secondary | ICD-10-CM | POA: Diagnosis not present

## 2021-09-20 DIAGNOSIS — E782 Mixed hyperlipidemia: Secondary | ICD-10-CM | POA: Diagnosis not present

## 2021-09-20 DIAGNOSIS — E538 Deficiency of other specified B group vitamins: Secondary | ICD-10-CM | POA: Diagnosis not present

## 2021-10-01 DIAGNOSIS — M79641 Pain in right hand: Secondary | ICD-10-CM | POA: Diagnosis not present

## 2021-10-01 DIAGNOSIS — Z1589 Genetic susceptibility to other disease: Secondary | ICD-10-CM | POA: Diagnosis not present

## 2021-10-01 DIAGNOSIS — Z79899 Other long term (current) drug therapy: Secondary | ICD-10-CM | POA: Diagnosis not present

## 2021-10-01 DIAGNOSIS — L409 Psoriasis, unspecified: Secondary | ICD-10-CM | POA: Diagnosis not present

## 2021-10-01 DIAGNOSIS — R5383 Other fatigue: Secondary | ICD-10-CM | POA: Diagnosis not present

## 2021-10-01 DIAGNOSIS — M79642 Pain in left hand: Secondary | ICD-10-CM | POA: Diagnosis not present

## 2021-10-01 DIAGNOSIS — Z111 Encounter for screening for respiratory tuberculosis: Secondary | ICD-10-CM | POA: Diagnosis not present

## 2021-10-01 DIAGNOSIS — D649 Anemia, unspecified: Secondary | ICD-10-CM | POA: Diagnosis not present

## 2021-10-01 DIAGNOSIS — L4059 Other psoriatic arthropathy: Secondary | ICD-10-CM | POA: Diagnosis not present

## 2021-10-21 NOTE — Progress Notes (Signed)
Office Visit    Patient Name: GLENNIS MONTENEGRO Date of Encounter: 10/22/2021  PCP:  Lavone Orn, [facility]  Cardiologist:  Lauree Chandler, MD  Advanced Practice Provider:  No care team member to display Electrophysiologist:  None    Chief Complaint    Jermaine Jennings is a 65 y.o. male with a hx of diabetes mellitus type 2, hypertension, hyperlipidemia and coronary artery disease last seen September 24, 2020 by Dr. Angelena Form. He presents today for his annual follow-up appointment.  Past Medical History    Past Medical History:  Diagnosis Date   Asthma    Asymptomatic LV dysfunction 10/04/2016   Benign essential HTN    CAD in native artery 95/63/8756   Complication of anesthesia    " DIFFICULTY BREATHING ONE TIME "   Coronary artery disease    Diabetes type 2, controlled ([place]) 10/04/2016   DM (diabetes mellitus), type 2 ([facility])    Erectile dysfunction    Hypertriglyceridemia    Psoriatic arthritis ([place])    Research study patient 10/04/2016   Rotator cuff disorder    S/P angioplasty with stent, 10/03/16 DES to pRCA & DES to Diag 10/04/2016   Vitamin B 12 deficiency    Past Surgical History:  Procedure Laterality Date   APPENDECTOMY  1999   CARDIAC CATHETERIZATION N/A 10/03/2016   Procedure: Left Heart Cath and Coronary Angiography;  Surgeon: Burnell Blanks, MD;  Location: [facility];  Service: Cardiovascular;  Laterality: N/A;   CARDIAC CATHETERIZATION N/A 10/03/2016   Procedure: Coronary Stent Intervention;  Surgeon: Burnell Blanks, MD;  Location: [place] [facility];  Service: Cardiovascular;  Laterality: N/A;   colon polyps     CORONARY STENT PLACEMENT  10/03/2016   Severe stenosis proximal moderate caliber co-dominant RCA now s/p successful PTCA/DES x 1 proximal RCA.    KNEE SURGERY Right 1975   ligament and spurs   KNEE SURGERY Left 1980   ligaments and spurs   TOTAL KNEE ARTHROPLASTY  1990's     Allergies  Allergies  Allergen Reactions   Gabapentin Other (See Comments)    Other reaction(s): sleepy sleepy   Golimumab Other (See Comments)   Indomethacin Other (See Comments)    Other reaction(s): h/a's headache headache   Penicillins Rash    Has patient had a PCN reaction causing immediate rash, facial/tongue/throat swelling, SOB or lightheadedness with hypotension:yes Has patient had a PCN reaction causing severe rash involving mucus membranes or skin necrosis:No Has patient had a PCN reaction that required hospitalization:No Has patient had a PCN reaction occurring within the last 10 years:NO If all of the above answers are "NO", then may proceed with Cephalosporin use.     History of Present Illness    Jermaine Jennings is a 65 y.o. male with a hx of diabetes mellitus type 2, hypertension, hyperlipidemia and coronary artery disease last seen September 24, 2020 by Dr. Angelena Form.  He was admitted to [facility] November 2017 with unstable angina.  Underwent cardiac catheterization on 10/03/2016 and was found to have severe stenosis in the proximal RCA, diagonal and CTO of the OM branch.  Stents were placed in the RCA and diagonal branches.  LVEF equals 45 to 50%.  Echocardiogram 02/3328 showed LV systolic function was normal with LVEF equal to 55 to 60%.  He followed up last November with Dr. Angelena Form at that time he denied any chest pain, dyspnea, palpitations, lower extremity edema, orthopnea,  PND, dizziness, near-syncope or syncope.  The plan was to continue his medical therapy of aspirin, statin, and beta-blocker.  His blood pressure was well controlled at that time.  Today, he overall feels well.  He does occasionally have what he describes as a lump in his chest or chest tightness that has been going on for the last couple of months.  Usually lasts a couple hours and is not associated with exercise.  He notices it when he is sitting in the chair and also occasionally while he is  walking around.  He states that the discomfort is different from when he had to get his stents placed in 2017.  He also has asthma so he does get short of breath and he does not notice that his shortness of breath necessarily worsens with this tightness in his chest.  He has been getting his lipid panel checked at his PCP.  His LDL was 54 which is excellent.  Goal for him would be below 70 due to his previous coronary stenting.  His triglycerides have improved and are 150 as of 08/2021.  His hemoglobin A1c was still elevated at 7.9 he has been working with his primary care to lower his blood sugar.  His blood pressure at home usually runs in the 622Q systolic.  Today it was slightly lower than usual at 98/50.  He did stated he had a low blood glucose this morning in the 60s but felt better after eating some breakfast.  His EKG was reviewed today and showed normal sinus rhythm, rate of 80 bpm.  He does have occasional PACs.  He has not noticed any palpitations or fluttering in his chest.  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  Sometimes he does experience some lower extremity edema when he is up on his feet most of the day but this resolves after he puts his feet up at night.  Reports no shortness of breath nor dyspnea on exertion. Reports no chest pain, pressure, or tightness. No edema, orthopnea, PND. Reports no palpitations.     EKGs/Labs/Other Studies Reviewed:   The following studies were reviewed today:  Echo 12/26/16:  Left ventricle: The cavity size was normal. Wall thickness was   increased in a pattern of mild LVH. Systolic function was normal.   The estimated ejection fraction was in the range of 55% to 60%.   Wall motion was normal; there were no regional wall motion   abnormalities. Left ventricular diastolic function parameters   were normal. - Left atrium: The atrium was mildly dilated. - Atrial septum: No defect or patent foramen ovale was  identified.  Cardiac catheterization performed 10/03/2016  There is mild left ventricular systolic dysfunction. LV end diastolic pressure is normal. The left ventricular ejection fraction is 45-50% by visual estimate. There is no mitral valve regurgitation. Ost 1st Mrg lesion, 100 %stenosed. A STENT PROMUS PREM MR 2.5X28 drug eluting stent was successfully placed. Ost RCA to Prox RCA lesion, 99 %stenosed. Post intervention, there is a 0% residual stenosis. A STENT PROMUS PREM MR 2.75X16 drug eluting stent was successfully placed. 1st Diag lesion, 99 %stenosed. Post intervention, there is a 0% residual stenosis.   1. Unstable angina 2. Severe stenosis proximal moderate caliber co-dominant RCA now s/p successful PTCA/DES x 1 proximal RCA.  3. Severe stenosis proximal segment of large Diagonal branch now s/p successful PTCA/DES x 1 Diagonal branch.  4. Chronic occlusion large early obtuse  marginal branch. This branch fills from left to left collaterals.  5. Moderate disease mid LAD 6. Mild LV systolic dysfunction   Recommendations: Will continue ASA and Brilinta (TWILIGHT study). Start a beta blocker and statin. Home tomorrow.     EKG:  EKG is  ordered today.  The ekg ordered today demonstrates NSR, rate 80 bpm with occasional PAC  Recent Labs: No results found for requested labs within last 8760 hours.  Recent Lipid Panel    Component Value Date/Time   CHOL 105 02/02/2017 0749   TRIG 129 02/02/2017 0749   HDL 34 (L) 02/02/2017 0749   CHOLHDL 3.1 02/02/2017 0749   LDLCALC 45 02/02/2017 0749    Home Medications   Current Meds  Medication Sig   acetaminophen (TYLENOL) 325 MG tablet Take 2 tablets (650 mg total) by mouth every 4 (four) hours as needed for headache or mild pain.   aspirin EC 81 MG tablet Take 1 tablet (81 mg total) by mouth daily.   atorvastatin (LIPITOR) 80 MG tablet TAKE 1 TABLET BY MOUTH EVERY DAY AT 6 PM   carvedilol (COREG) 6.25 MG tablet Take 6.25 mg by  mouth in the morning and at bedtime.    DULoxetine (CYMBALTA) 60 MG capsule Take 60 mg by mouth daily.   halobetasol (ULTRAVATE) 0.05 % cream Apply 1 application topically 2 (two) times daily as needed (for psoriasis).    insulin glargine (LANTUS) 100 UNIT/ML injection Inject 80 Units into the skin daily. 45 units in the morning and 45 units in the pm   losartan (COZAAR) 50 MG tablet Take 50 mg by mouth every evening.    LYRICA 150 MG capsule Take 150 mg by mouth 2 (two) times daily.   Menthol, Topical Analgesic, (ICY HOT BACK EX) Apply 1 application topically daily as needed (BACK PAIN).   metFORMIN (GLUCOPHAGE-XR) 500 MG 24 hr tablet Take 2 tablets (1,000 mg total) by mouth 2 (two) times daily.   nitroGLYCERIN (NITROSTAT) 0.4 MG SL tablet Place 1 tablet (0.4 mg total) under the tongue every 5 (five) minutes as needed for chest pain.   Semaglutide,0.25 or 0.5MG /DOS, 2 MG/1.5ML SOPN Inject 0.25 mLs into the skin once a week.   tamsulosin (FLOMAX) 0.4 MG CAPS capsule Take 0.4 mg by mouth every morning.   traMADol (ULTRAM) 50 MG tablet Take 50 mg by mouth every 6 (six) hours as needed (for pain.).     Review of Systems      All other systems reviewed and are otherwise negative except as noted above.  Physical Exam    VS:  BP (!) 98/50   Pulse 80   Ht 6\' 2"  (1.88 m)   Wt 245 lb 9.6 oz (111.4 kg)   SpO2 95%   BMI 31.53 kg/m  , BMI Body mass index is 31.53 kg/m.  Wt Readings from Last 3 Encounters:  10/22/21 245 lb 9.6 oz (111.4 kg)  09/24/20 250 lb 3.2 oz (113.5 kg)  09/19/19 256 lb 12.8 oz (116.5 kg)     GEN: Well nourished, well developed, in no acute distress. HEENT: normal. Cardiac: RRR, no murmurs, rubs, or gallops. No clubbing, cyanosis, edema.  Radials/PT 2+ and equal bilaterally.  Respiratory:  Respirations regular and unlabored, clear to auscultation bilaterally. GI: Soft, nontender, nondistended. MS: No deformity or atrophy. Skin: Warm and dry, no rash. Neuro:   Strength and sensation are intact. Psych: Normal affect.  Assessment & Plan    CAD with nonspecific chest tightness -PCI  back in November 2017 with drug-eluting stents placed in the RCA and diagonal.  He has CTO of the OM branch -nonspecific chest pain, low likelihood for cardiac however he has not had a stress test recently. We will order a exercise stress test today -GDMT: Aspirin, statin, and beta-blocker -Brilinta discontinued after a year.   Hypertension -A little low today at 98/50 -Continue current medical therapy -528U systolic at home -Continue to monitor daily and write down BP readings   Hyperlipidemia -Last LDL was 45 in March 2018 -LDL in 10/22 was 54, triglycerides were 150 -Continue Lipitor 80 mg daily -Lipid panel and LFTs per pcp  Diabetes mellitus type 2 -Last hemoglobin A1c was 7.9 in 10/22 -Will defer to PCP for management  Disposition: Follow up in 1 year(s) with Lauree Chandler, MD or APP.  Signed, Elgie Collard, PA-C 10/22/2021, 11:01 AM  [facility]

## 2021-10-22 ENCOUNTER — Ambulatory Visit: Payer: BC Managed Care – PPO | Admitting: Physician Assistant

## 2021-10-22 ENCOUNTER — Other Ambulatory Visit: Payer: Self-pay

## 2021-10-22 ENCOUNTER — Encounter: Payer: Self-pay | Admitting: Physician Assistant

## 2021-10-22 VITALS — BP 98/50 | HR 80 | Ht 74.0 in | Wt 245.6 lb

## 2021-10-22 DIAGNOSIS — Z794 Long term (current) use of insulin: Secondary | ICD-10-CM

## 2021-10-22 DIAGNOSIS — E785 Hyperlipidemia, unspecified: Secondary | ICD-10-CM

## 2021-10-22 DIAGNOSIS — E1165 Type 2 diabetes mellitus with hyperglycemia: Secondary | ICD-10-CM

## 2021-10-22 DIAGNOSIS — I25119 Atherosclerotic heart disease of native coronary artery with unspecified angina pectoris: Secondary | ICD-10-CM | POA: Diagnosis not present

## 2021-10-22 DIAGNOSIS — R079 Chest pain, unspecified: Secondary | ICD-10-CM

## 2021-10-22 DIAGNOSIS — I10 Essential (primary) hypertension: Secondary | ICD-10-CM

## 2021-10-22 NOTE — Patient Instructions (Addendum)
Medication Instructions:  Your physician recommends that you continue on your current medications as directed. Please refer to the Current Medication list given to you today.  *If you need a refill on your cardiac medications before your next appointment, please call your pharmacy*   Lab Work: NONE If you have labs (blood work) drawn today and your tests are completely normal, you will receive your results only by: [place] (if you have MyChart) OR A paper copy in the mail If you have any lab test that is abnormal or we need to change your treatment, we will call you to review the results.   Testing/Procedures: Your physician has requested that you have en exercise stress myoview. For further information please visit [email or web address]. Please follow instruction sheet, as given.   Follow-Up: At [facility], you and your health needs are our priority.  As part of our continuing mission to provide you with exceptional heart care, we have created designated Provider Care Teams.  These Care Teams include your primary Cardiologist (physician) and Advanced Practice Providers (APPs -  Physician Assistants and Nurse Practitioners) who all work together to provide you with the care you need, when you need it.  We recommend signing up for the patient portal called "MyChart".  Sign up information is provided on this After Visit Summary.  MyChart is used to connect with patients for Virtual Visits (Telemedicine).  Patients are able to view lab/test results, encounter notes, upcoming appointments, etc.  Non-urgent messages can be sent to your provider as well.   To learn more about what you can do with MyChart, go to [email or web address].    Your next appointment:   1 year(s)  The format for your next appointment:   In Person  Provider:   Lauree Chandler, MD or APP

## 2021-10-24 ENCOUNTER — Telehealth (HOSPITAL_COMMUNITY): Payer: Self-pay

## 2021-10-24 NOTE — Telephone Encounter (Signed)
Attempted to contact the patient. There was no answer. Will try again later. S.Herminia Warren EMTP

## 2021-10-30 ENCOUNTER — Telehealth (HOSPITAL_COMMUNITY): Payer: Self-pay | Admitting: *Deleted

## 2021-10-30 ENCOUNTER — Other Ambulatory Visit: Payer: Self-pay

## 2021-10-30 MED ORDER — ATORVASTATIN CALCIUM 80 MG PO TABS: 80.0000 mg | ORAL_TABLET | Freq: Every day | ORAL | 3 refills | Status: AC

## 2021-10-30 NOTE — Telephone Encounter (Signed)
Attempted to call patient regarding upcoming appointment- no answer, unable to leave a message. Letter sent with instructions.  Jermaine Jennings

## 2021-10-31 ENCOUNTER — Ambulatory Visit (HOSPITAL_COMMUNITY): Payer: BC Managed Care – PPO | Attending: Physician Assistant

## 2021-10-31 ENCOUNTER — Other Ambulatory Visit: Payer: Self-pay

## 2021-10-31 DIAGNOSIS — R079 Chest pain, unspecified: Secondary | ICD-10-CM | POA: Diagnosis not present

## 2021-10-31 LAB — MYOCARDIAL PERFUSION IMAGING
Angina Index: 0
Duke Treadmill Score: 5
Estimated workload: 7
Exercise duration (min): 5 min
LV dias vol: 127 mL (ref 62–150)
LV sys vol: 78 mL
MPHR: 155 {beats}/min
Nuc Stress EF: 38 %
Peak HR: 134 {beats}/min
Percent HR: 86 %
Rest HR: 67 {beats}/min
Rest Nuclear Isotope Dose: 10.1 mCi
SDS: 0
SRS: 0
SSS: 0
ST Depression (mm): 0 mm
Stress Nuclear Isotope Dose: 32.7 mCi
TID: 1.01

## 2021-10-31 MED ORDER — TECHNETIUM TC 99M TETROFOSMIN IV KIT
10.1000 | PACK | Freq: Once | INTRAVENOUS | Status: AC | PRN
  Administered 2021-10-31: 10.1 via INTRAVENOUS
  Filled 2021-10-31: qty 11

## 2021-10-31 MED ORDER — TECHNETIUM TC 99M TETROFOSMIN IV KIT
31.9000 | PACK | Freq: Once | INTRAVENOUS | Status: AC | PRN
  Administered 2021-10-31: 31.9 via INTRAVENOUS
  Filled 2021-10-31: qty 32

## 2021-11-01 ENCOUNTER — Encounter: Payer: Self-pay | Admitting: Physician Assistant

## 2021-11-01 ENCOUNTER — Other Ambulatory Visit: Payer: Self-pay | Admitting: *Deleted

## 2021-11-01 DIAGNOSIS — I25119 Atherosclerotic heart disease of native coronary artery with unspecified angina pectoris: Secondary | ICD-10-CM

## 2021-12-05 ENCOUNTER — Ambulatory Visit (HOSPITAL_COMMUNITY): Payer: BC Managed Care – PPO | Attending: Cardiovascular Disease

## 2021-12-05 ENCOUNTER — Other Ambulatory Visit: Payer: Self-pay

## 2021-12-05 DIAGNOSIS — I25119 Atherosclerotic heart disease of native coronary artery with unspecified angina pectoris: Secondary | ICD-10-CM

## 2021-12-05 LAB — ECHOCARDIOGRAM COMPLETE
Area-P 1/2: 3.46 cm2
S' Lateral: 3.5 cm

## 2021-12-06 ENCOUNTER — Encounter: Payer: Self-pay | Admitting: Physician Assistant

## 2022-01-01 DIAGNOSIS — L4059 Other psoriatic arthropathy: Secondary | ICD-10-CM | POA: Diagnosis not present

## 2022-01-01 DIAGNOSIS — Z1589 Genetic susceptibility to other disease: Secondary | ICD-10-CM | POA: Diagnosis not present

## 2022-01-01 DIAGNOSIS — L409 Psoriasis, unspecified: Secondary | ICD-10-CM | POA: Diagnosis not present

## 2022-01-01 DIAGNOSIS — M150 Primary generalized (osteo)arthritis: Secondary | ICD-10-CM | POA: Diagnosis not present

## 2022-01-21 DIAGNOSIS — I10 Essential (primary) hypertension: Secondary | ICD-10-CM | POA: Diagnosis not present

## 2022-01-21 DIAGNOSIS — I2510 Atherosclerotic heart disease of native coronary artery without angina pectoris: Secondary | ICD-10-CM | POA: Diagnosis not present

## 2022-01-21 DIAGNOSIS — E1142 Type 2 diabetes mellitus with diabetic polyneuropathy: Secondary | ICD-10-CM | POA: Diagnosis not present

## 2022-07-01 DIAGNOSIS — L4059 Other psoriatic arthropathy: Secondary | ICD-10-CM | POA: Diagnosis not present

## 2022-07-01 DIAGNOSIS — M1991 Primary osteoarthritis, unspecified site: Secondary | ICD-10-CM | POA: Diagnosis not present

## 2022-07-01 DIAGNOSIS — L409 Psoriasis, unspecified: Secondary | ICD-10-CM | POA: Diagnosis not present

## 2022-07-01 DIAGNOSIS — Z1589 Genetic susceptibility to other disease: Secondary | ICD-10-CM | POA: Diagnosis not present

## 2022-08-20 DIAGNOSIS — E1142 Type 2 diabetes mellitus with diabetic polyneuropathy: Secondary | ICD-10-CM | POA: Diagnosis not present

## 2022-08-20 DIAGNOSIS — Z5181 Encounter for therapeutic drug level monitoring: Secondary | ICD-10-CM | POA: Diagnosis not present

## 2022-08-20 DIAGNOSIS — Z23 Encounter for immunization: Secondary | ICD-10-CM | POA: Diagnosis not present

## 2022-08-20 DIAGNOSIS — E782 Mixed hyperlipidemia: Secondary | ICD-10-CM | POA: Diagnosis not present

## 2022-08-20 DIAGNOSIS — Z0000 Encounter for general adult medical examination without abnormal findings: Secondary | ICD-10-CM | POA: Diagnosis not present

## 2022-08-20 DIAGNOSIS — I10 Essential (primary) hypertension: Secondary | ICD-10-CM | POA: Diagnosis not present

## 2022-08-20 DIAGNOSIS — E538 Deficiency of other specified B group vitamins: Secondary | ICD-10-CM | POA: Diagnosis not present

## 2022-08-20 DIAGNOSIS — G608 Other hereditary and idiopathic neuropathies: Secondary | ICD-10-CM | POA: Diagnosis not present

## 2022-09-23 DIAGNOSIS — H0102B Squamous blepharitis left eye, upper and lower eyelids: Secondary | ICD-10-CM | POA: Diagnosis not present

## 2022-09-23 DIAGNOSIS — H0102A Squamous blepharitis right eye, upper and lower eyelids: Secondary | ICD-10-CM | POA: Diagnosis not present

## 2022-09-23 DIAGNOSIS — E119 Type 2 diabetes mellitus without complications: Secondary | ICD-10-CM | POA: Diagnosis not present

## 2022-09-23 DIAGNOSIS — H02831 Dermatochalasis of right upper eyelid: Secondary | ICD-10-CM | POA: Diagnosis not present

## 2022-10-21 DIAGNOSIS — E1142 Type 2 diabetes mellitus with diabetic polyneuropathy: Secondary | ICD-10-CM | POA: Diagnosis not present

## 2022-10-21 DIAGNOSIS — I10 Essential (primary) hypertension: Secondary | ICD-10-CM | POA: Diagnosis not present

## 2022-10-21 DIAGNOSIS — E782 Mixed hyperlipidemia: Secondary | ICD-10-CM | POA: Diagnosis not present

## 2022-10-21 DIAGNOSIS — N401 Enlarged prostate with lower urinary tract symptoms: Secondary | ICD-10-CM | POA: Diagnosis not present

## 2022-12-19 DIAGNOSIS — E1142 Type 2 diabetes mellitus with diabetic polyneuropathy: Secondary | ICD-10-CM | POA: Diagnosis not present

## 2022-12-19 DIAGNOSIS — G4733 Obstructive sleep apnea (adult) (pediatric): Secondary | ICD-10-CM | POA: Diagnosis not present

## 2022-12-19 DIAGNOSIS — F5101 Primary insomnia: Secondary | ICD-10-CM | POA: Diagnosis not present

## 2022-12-19 DIAGNOSIS — I10 Essential (primary) hypertension: Secondary | ICD-10-CM | POA: Diagnosis not present

## 2023-01-01 DIAGNOSIS — R5383 Other fatigue: Secondary | ICD-10-CM | POA: Diagnosis not present

## 2023-01-01 DIAGNOSIS — M1991 Primary osteoarthritis, unspecified site: Secondary | ICD-10-CM | POA: Diagnosis not present

## 2023-01-01 DIAGNOSIS — Z79899 Other long term (current) drug therapy: Secondary | ICD-10-CM | POA: Diagnosis not present

## 2023-01-01 DIAGNOSIS — Z1589 Genetic susceptibility to other disease: Secondary | ICD-10-CM | POA: Diagnosis not present

## 2023-01-01 DIAGNOSIS — L409 Psoriasis, unspecified: Secondary | ICD-10-CM | POA: Diagnosis not present

## 2023-01-01 DIAGNOSIS — Z111 Encounter for screening for respiratory tuberculosis: Secondary | ICD-10-CM | POA: Diagnosis not present

## 2023-01-01 DIAGNOSIS — L4059 Other psoriatic arthropathy: Secondary | ICD-10-CM | POA: Diagnosis not present

## 2023-02-17 DIAGNOSIS — E1142 Type 2 diabetes mellitus with diabetic polyneuropathy: Secondary | ICD-10-CM | POA: Diagnosis not present

## 2023-02-17 DIAGNOSIS — J40 Bronchitis, not specified as acute or chronic: Secondary | ICD-10-CM | POA: Diagnosis not present

## 2023-09-17 NOTE — Progress Notes (Signed)
Chief Complaint  Patient presents with   Follow-up    CAD    History of Present Illness: 67 yo male with history of DM, HTN, HLD and CAD who is here today for cardiac follow up. He was admitted to [facility] November 2017 with unstable angina. Cardiac cath 10/03/16 with severe stenosis in the proximal RCA, Diagonal and CTO of the OM branch. Drug eluting stents were placed in the RCA and Diagonal branches. LVEF=45-50%. Echo 12/26/16 showed LV systolic was normal with LVEF=55-60%. Nuclear stress test in December 2022 with no ischemia. Echo 12/05/21 with LVEF=60-65%. No significant valve disease.   He is here today for follow up. The patient denies any dyspnea, palpitations, lower extremity edema, orthopnea, PND, dizziness, near syncope or syncope. He was sawing wood last week and had some chest pressure but this resolved quickly with a NTG.   Primary Care Physician: Thana Ates, MD  Past Medical History:  Diagnosis Date   Asthma    Asymptomatic LV dysfunction 10/04/2016   Benign essential HTN    CAD (coronary artery disease) 10/04/2016   Myoview 12/22: No ischemia or infarction, EF 38, no TID; intermediate risk // Echo 1/23: EF 60-65, no RWMA, GR 1 DD, normal RVSF, trivial MR, mild dilation of ascending aorta (41 mm)   Complication of anesthesia    " DIFFICULTY BREATHING ONE TIME "   Coronary artery disease    Diabetes type 2, controlled (HCC) 10/04/2016   DM (diabetes mellitus), type 2 (HCC)    Erectile dysfunction    Hypertriglyceridemia    Psoriatic arthritis (HCC)    Research study patient 10/04/2016   Rotator cuff disorder    S/P angioplasty with stent, 10/03/16 DES to pRCA & DES to Diag 10/04/2016   Vitamin B 12 deficiency     Past Surgical History:  Procedure Laterality Date   APPENDECTOMY  1999   CARDIAC CATHETERIZATION N/A 10/03/2016   Procedure: Left Heart Cath and Coronary Angiography;  Surgeon: Kathleene Hazel, MD;  Location: [facility]VASIVE CV LAB;  Service:  Cardiovascular;  Laterality: N/A;   CARDIAC CATHETERIZATION N/A 10/03/2016   Procedure: Coronary Stent Intervention;  Surgeon: Kathleene Hazel, MD;  Location: [facility] INVASIVE CV LAB;  Service: Cardiovascular;  Laterality: N/A;   colon polyps     CORONARY STENT PLACEMENT  10/03/2016   Severe stenosis proximal moderate caliber co-dominant RCA now s/p successful PTCA/DES x 1 proximal RCA.    KNEE SURGERY Right 1975   ligament and spurs   KNEE SURGERY Left 1980   ligaments and spurs   TOTAL KNEE ARTHROPLASTY  1990's    Current Outpatient Medications  Medication Sig Dispense Refill   acetaminophen (TYLENOL) 325 MG tablet Take 2 tablets (650 mg total) by mouth every 4 (four) hours as needed for headache or mild pain.     aspirin EC 81 MG tablet Take 1 tablet (81 mg total) by mouth daily. 90 tablet 3   atorvastatin (LIPITOR) 80 MG tablet Take 1 tablet (80 mg total) by mouth daily at 6 PM. 90 tablet 3   carvedilol (COREG) 6.25 MG tablet Take 6.25 mg by mouth in the morning and at bedtime.      DULoxetine (CYMBALTA) 60 MG capsule Take 60 mg by mouth daily.     halobetasol (ULTRAVATE) 0.05 % cream Apply 1 application topically 2 (two) times daily as needed (for psoriasis).      Insulin Glargine (BASAGLAR KWIKPEN) 100 UNIT/ML SMARTSIG:50 Unit(s) SUB-Q Twice Daily  losartan (COZAAR) 50 MG tablet Take 50 mg by mouth every evening.   1   LYRICA 150 MG capsule Take 150 mg by mouth 2 (two) times daily.  0   Menthol, Topical Analgesic, (ICY HOT BACK EX) Apply 1 application topically daily as needed (BACK PAIN).     metFORMIN (GLUCOPHAGE-XR) 500 MG 24 hr tablet Take 2 tablets (1,000 mg total) by mouth 2 (two) times daily.  1   nitroGLYCERIN (NITROSTAT) 0.4 MG SL tablet Place 1 tablet (0.4 mg total) under the tongue every 5 (five) minutes as needed for chest pain. 25 tablet 12   OZEMPIC, 1 MG/DOSE, 4 MG/3ML SOPN Inject 1 mg into the skin once a week.     tamsulosin (FLOMAX) 0.4 MG CAPS capsule Take  0.4 mg by mouth every morning.     traMADol (ULTRAM) 50 MG tablet Take 50 mg by mouth every 6 (six) hours as needed (for pain.).     No current facility-administered medications for this visit.    Allergies  Allergen Reactions   Gabapentin Other (See Comments)    Other reaction(s): sleepy sleepy   Golimumab Other (See Comments)   Indomethacin Other (See Comments)    Other reaction(s): h/a's headache headache   Penicillins Rash    Has patient had a PCN reaction causing immediate rash, facial/tongue/throat swelling, SOB or lightheadedness with hypotension:yes Has patient had a PCN reaction causing severe rash involving mucus membranes or skin necrosis:No Has patient had a PCN reaction that required hospitalization:No Has patient had a PCN reaction occurring within the last 10 years:NO If all of the above answers are "NO", then may proceed with Cephalosporin use.     Social History   Socioeconomic History   Marital status: Married    Spouse name: Not on file   Number of children: Not on file   Years of education: Not on file   Highest education level: Not on file  Occupational History   Not on file  Tobacco Use   Smoking status: Never   Smokeless tobacco: Never  Vaping Use   Vaping status: Never Used  Substance and Sexual Activity   Alcohol use: Yes    Comment: rarely   Drug use: No   Sexual activity: Not on file  Other Topics Concern   Not on file  Social History Narrative   Not on file   Social Determinants of Health   Financial Resource Strain: Not on file  Food Insecurity: Not on file  Transportation Needs: Not on file  Physical Activity: Not on file  Stress: Not on file  Social Connections: Not on file  Intimate Partner Violence: Not on file    Family History  Problem Relation Age of Onset   Heart disease Mother    Hypertension Mother    Breast cancer Mother    Glaucoma Mother    CAD Mother    CVA Father    Hypertension Father    Neuropathy  Father    Other Father        carotid endarterectomy   Hypertension Brother    Diabetes Brother    Arthritis Maternal Grandfather    Healthy Son    Healthy Son     Review of Systems:  As stated in the HPI and otherwise negative.   BP 120/70   Pulse 83   Ht 6\' 2"  (1.88 m)   Wt 109.3 kg   SpO2 98%   BMI 30.94 kg/m   Physical Examination:  General:  Well developed, well nourished, NAD  HEENT: OP clear, mucus membranes moist  SKIN: warm, dry. No rashes. Neuro: No focal deficits  Musculoskeletal: Muscle strength 5/5 all ext  Psychiatric: Mood and affect normal  Neck: No JVD, no carotid bruits, no thyromegaly, no lymphadenopathy.  Lungs:Clear bilaterally, no wheezes, rhonci, crackles Cardiovascular: Regular rate and rhythm. No murmurs, gallops or rubs. Abdomen:Soft. Bowel sounds present. Non-tender.  Extremities: No lower extremity edema. Pulses are 2 + in the bilateral DP/PT.  EKG:  EKG is ordered today. The ekg ordered today demonstrates  EKG Interpretation Date/Time:  Friday September 18 2023 14:52:28 EDT Ventricular Rate:  83 PR Interval:  162 QRS Duration:  96 QT Interval:  376 QTC Calculation: 441 R Axis:   -12  Text Interpretation: Normal sinus rhythm Normal ECG When compared with ECG of 04-Oct-2016 03:54, No significant change was found Confirmed by Verne Carrow [phone]) on 09/18/2023 2:55:59 PM    Recent Labs: No results found for requested labs within last 365 days.   Lipid Panel    Component Value Date/Time   CHOL 105 02/02/2017 0749   TRIG 129 02/02/2017 0749   HDL 34 (L) 02/02/2017 0749   CHOLHDL 3.1 02/02/2017 0749   LDLCALC 45 02/02/2017 0749     Wt Readings from Last 3 Encounters:  09/18/23 109.3 kg  10/31/21 111.1 kg  10/22/21 111.4 kg    Assessment and Plan:   1. CAD without angina: He had PCI in November 2017 with drug eluting stents placed in the RCA and Diagonal. He has a CTO of the OM branch. Nuclear stress test in 2022 without  ischemia. LV function normal by echo in 2023. He had one episode of chest pain last week with heavy exertion. He is very active and has had no other chest pain. Will continue ASA, statin and beta blocker.  He will call back if his chest pain increases in frequency.   2. HTN: BP is well controlled. No changes  Labs/ tests ordered today include:   Orders Placed This Encounter  Procedures   EKG 12-Lead   Disposition:   F/U with me in 12 months  Signed, Verne Carrow, MD 09/18/2023 3:11 PM    [facility] [address] [place], [place], [place]  [address] Phone: [phone]; Fax: [phone]

## 2023-09-18 ENCOUNTER — Ambulatory Visit: Payer: Medicare Other | Attending: Cardiovascular Disease | Admitting: Cardiovascular Disease

## 2023-09-18 ENCOUNTER — Encounter: Payer: Self-pay | Admitting: Cardiovascular Disease

## 2023-09-18 VITALS — BP 120/70 | HR 83 | Ht 74.0 in | Wt 241.0 lb

## 2023-09-18 DIAGNOSIS — I25119 Atherosclerotic heart disease of native coronary artery with unspecified angina pectoris: Secondary | ICD-10-CM

## 2023-09-18 DIAGNOSIS — I10 Essential (primary) hypertension: Secondary | ICD-10-CM

## 2023-09-18 NOTE — Patient Instructions (Signed)

## 2024-11-29 ENCOUNTER — Other Ambulatory Visit: Payer: Self-pay

## 2024-11-29 DIAGNOSIS — K8681 Exocrine pancreatic insufficiency: Secondary | ICD-10-CM

## 2024-12-30 ENCOUNTER — Ambulatory Visit: Admitting: Family

## 2024-12-30 ENCOUNTER — Other Ambulatory Visit: Payer: Self-pay

## 2024-12-30 DIAGNOSIS — M25511 Pain in right shoulder: Secondary | ICD-10-CM
